# Patient Record
Sex: Female | Born: 1990 | Hispanic: No | Marital: Single | Smoking: Current every day smoker
Health system: Southern US, Community
[De-identification: ages and names within clinical notes are randomized; demographics above are authoritative.]

## PROBLEM LIST (undated history)

## (undated) HISTORY — PX: ANTERIOR CRUCIATE LIGAMENT REPAIR: SHX115

---

## 2017-01-14 ENCOUNTER — Ambulatory Visit (HOSPITAL_COMMUNITY)
Admission: RE | Admit: 2017-01-14 | Discharge: 2017-01-14 | Disposition: A | Discharge status: Home / Self Care | Payer: Self-pay | Attending: Psychiatry | Admitting: Psychiatry

## 2017-01-14 DIAGNOSIS — F331 Major depressive disorder, recurrent, moderate: Secondary | ICD-10-CM | POA: Insufficient documentation
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
## 2017-01-14 NOTE — H&P (Signed)
Behavioral Health Medical Screening Exam  Shelly Nunez is an 26 y.o. female.  Total Time spent with patient: 20 minutes  Psychiatric Specialty Exam: Physical Exam  Constitutional: She is oriented to person, place, and time. She appears well-developed and well-nourished.  HENT:  Head: Normocephalic and atraumatic.  Right Ear: External ear normal.  Left Ear: External ear normal.  Neck: Normal range of motion.  Cardiovascular: Normal rate, regular rhythm, normal heart sounds and intact distal pulses.   Respiratory: Effort normal and breath sounds normal.  Musculoskeletal: Normal range of motion.  Neurological: She is alert and oriented to person, place, and time.  Skin: Skin is warm and dry.    Review of Systems  Psychiatric/Behavioral: Positive for depression. Negative for hallucinations, memory loss, substance abuse and suicidal ideas. The patient is not nervous/anxious and does not have insomnia.   All other systems reviewed and are negative.   Blood pressure 127/76, pulse (!) 58, temperature 98.9 F (37.2 C), temperature source Oral, resp. rate 18, SpO2 100 %.There is no height or weight on file to calculate BMI.  General Appearance: Casual and Fairly Groomed  Eye Contact:  Good  Speech:  Clear and Coherent and Normal Rate  Volume:  Normal  Mood:  Depressed  Affect:  Congruent and Depressed  Thought Process:  Coherent, Goal Directed and Linear  Orientation:  Full (Time, Place, and Person)  Thought Content:  Logical  Suicidal Thoughts:  No  Homicidal Thoughts:  No  Memory:  Immediate;   Good Recent;   Good Remote;   Fair  Judgement:  Good  Insight:  Present  Psychomotor Activity:  Normal  Concentration: Concentration: Good and Attention Span: Good  Recall:  Good  Fund of Knowledge:Good  Language: Good  Akathisia:  No  Handed:  Right  AIMS (if indicated):     Assets:  Communication Skills Desire for Improvement Financial Resources/Insurance Housing Physical  Health Resilience Social Support Transportation Vocational/Educational  Sleep:       Musculoskeletal: Strength & Muscle Tone: within normal limits Gait & Station: normal Patient leans: N/A  Blood pressure 127/76, pulse (!) 58, temperature 98.9 F (37.2 C), temperature source Oral, resp. rate 18, SpO2 100 %.  Recommendations:  Based on my evaluation the patient does not appear to have an emergency medical condition.  Laveda Abbe, NP 01/14/2017, 4:20 PM

## 2018-01-25 ENCOUNTER — Emergency Department (HOSPITAL_COMMUNITY): Payer: Self-pay

## 2018-01-25 ENCOUNTER — Emergency Department (HOSPITAL_COMMUNITY)
Admission: EM | Admit: 2018-01-25 | Discharge: 2018-01-25 | Disposition: A | Discharge status: Home / Self Care | Payer: Self-pay | Attending: Emergency Medicine | Admitting: Emergency Medicine

## 2018-01-25 ENCOUNTER — Other Ambulatory Visit: Payer: Self-pay

## 2018-01-25 ENCOUNTER — Encounter (HOSPITAL_COMMUNITY): Payer: Self-pay

## 2018-01-25 ENCOUNTER — Encounter (HOSPITAL_COMMUNITY): Payer: Self-pay | Admitting: Emergency Medicine

## 2018-01-25 DIAGNOSIS — R2243 Localized swelling, mass and lump, lower limb, bilateral: Secondary | ICD-10-CM | POA: Insufficient documentation

## 2018-01-25 DIAGNOSIS — R600 Localized edema: Secondary | ICD-10-CM

## 2018-01-25 DIAGNOSIS — R609 Edema, unspecified: Secondary | ICD-10-CM

## 2018-01-25 DIAGNOSIS — D72819 Decreased white blood cell count, unspecified: Secondary | ICD-10-CM | POA: Insufficient documentation

## 2018-01-25 DIAGNOSIS — F17210 Nicotine dependence, cigarettes, uncomplicated: Secondary | ICD-10-CM | POA: Insufficient documentation

## 2018-01-25 DIAGNOSIS — J8410 Pulmonary fibrosis, unspecified: Secondary | ICD-10-CM | POA: Insufficient documentation

## 2018-01-25 DIAGNOSIS — D649 Anemia, unspecified: Secondary | ICD-10-CM | POA: Insufficient documentation

## 2018-01-25 DIAGNOSIS — E041 Nontoxic single thyroid nodule: Secondary | ICD-10-CM | POA: Insufficient documentation

## 2018-01-25 LAB — URINALYSIS, ROUTINE W REFLEX MICROSCOPIC
BILIRUBIN URINE: NEGATIVE
Glucose, UA: NEGATIVE mg/dL
KETONES UR: NEGATIVE mg/dL
Nitrite: NEGATIVE
PROTEIN: NEGATIVE mg/dL
Specific Gravity, Urine: 1.01 (ref 1.005–1.030)
pH: 6 (ref 5.0–8.0)

## 2018-01-25 LAB — COMPREHENSIVE METABOLIC PANEL
ALK PHOS: 39 U/L (ref 38–126)
ALT: 13 U/L — ABNORMAL LOW (ref 14–54)
ANION GAP: 11 (ref 5–15)
AST: 17 U/L (ref 15–41)
Albumin: 3.3 g/dL — ABNORMAL LOW (ref 3.5–5.0)
BILIRUBIN TOTAL: 0.2 mg/dL — AB (ref 0.3–1.2)
BUN: 13 mg/dL (ref 6–20)
CALCIUM: 9.1 mg/dL (ref 8.9–10.3)
CO2: 27 mmol/L (ref 22–32)
Chloride: 104 mmol/L (ref 101–111)
Creatinine, Ser: 0.71 mg/dL (ref 0.44–1.00)
GLUCOSE: 83 mg/dL (ref 65–99)
POTASSIUM: 3.6 mmol/L (ref 3.5–5.1)
Sodium: 142 mmol/L (ref 135–145)
TOTAL PROTEIN: 5.6 g/dL — AB (ref 6.5–8.1)

## 2018-01-25 LAB — CBC WITH DIFFERENTIAL/PLATELET
BASOS ABS: 0 10*3/uL (ref 0.0–0.1)
BASOS PCT: 0 %
Eosinophils Absolute: 0.1 10*3/uL (ref 0.0–0.7)
Eosinophils Relative: 4 %
HEMATOCRIT: 33.7 % — AB (ref 36.0–46.0)
Hemoglobin: 11 g/dL — ABNORMAL LOW (ref 12.0–15.0)
LYMPHS PCT: 50 %
Lymphs Abs: 1.9 10*3/uL (ref 0.7–4.0)
MCH: 29.6 pg (ref 26.0–34.0)
MCHC: 32.6 g/dL (ref 30.0–36.0)
MCV: 90.6 fL (ref 78.0–100.0)
MONO ABS: 0.3 10*3/uL (ref 0.1–1.0)
Monocytes Relative: 8 %
Neutro Abs: 1.5 10*3/uL — ABNORMAL LOW (ref 1.7–7.7)
Neutrophils Relative %: 38 %
Platelets: 171 10*3/uL (ref 150–400)
RBC: 3.72 MIL/uL — AB (ref 3.87–5.11)
RDW: 13.7 % (ref 11.5–15.5)
WBC: 3.8 10*3/uL — AB (ref 4.0–10.5)

## 2018-01-25 LAB — POC URINE PREG, ED: Preg Test, Ur: NEGATIVE

## 2018-01-25 MED ORDER — IOPAMIDOL (ISOVUE-370) INJECTION 76%
100.0000 mL | Freq: Once | INTRAVENOUS | Status: AC | PRN
  Administered 2018-01-25: 100 mL via INTRAVENOUS

## 2018-01-25 MED ORDER — IOPAMIDOL (ISOVUE-300) INJECTION 61%: 100.0000 mL | Freq: Once | INTRAVENOUS | Status: DC | PRN

## 2018-01-25 MED ORDER — IOPAMIDOL (ISOVUE-370) INJECTION 76%
INTRAVENOUS | Status: AC
  Filled 2018-01-25: qty 100

## 2018-01-25 NOTE — ED Triage Notes (Signed)
Pt reports having tick bite on left leg below knee several days ago and then today pt noticed swelling to bilateral ankles and numbness extending towards knees.

## 2018-01-25 NOTE — Care Management Note (Signed)
Case Management Note  CM consulted for no pcp and no ins.  CM placed [facility] for [facility] and the [facility] along with pharmacy and financial counseling on pt's AVS.  CM spoke with pt at bedside to discuss clinics, financial counseling, and pharmacy needs.  Elnoria Howard, PA.  No further CM needs noted at this time.

## 2018-01-25 NOTE — ED Notes (Signed)
Asked for urine  

## 2018-01-25 NOTE — Discharge Instructions (Signed)
Please see the information and instructions below regarding your visit.  Your diagnoses today include:  1. Bilateral lower extremity edema   2. Swelling   3. Thyroid nodule   4. Calcified granuloma of lung (HCC)    Your work-up is very reassuring today, but there may still be more testing to do such as testing for inflammatory or autoimmune diseases.  These test can be performed outpatient.  We have evaluated you for the emergent causes of leg swelling.  Tests performed today include: See side panel of your discharge paperwork for testing performed today. Vital signs are listed at the bottom of these instructions.   Your CT scan was very reassuring today.  It appears that the nodules in your lung are calcifications or granulomas, which can be areas where the body has walled off previous infections.    Your CT scan also showed that she may have a thyroid nodule of your left thyroid.  This will need to be followed with a nonemergent ultrasound of the thyroid when she obtain a primary care provider.  No evidence of masses in the belly concerning for cancer that are compressing on your vessels causing the swelling.  Medications prescribed:    Take any prescribed medications only as prescribed, and any over the counter medications only as directed on the packaging.  Home care instructions:  Please follow any educational materials contained in this packet.   Please try to reduce your alcohol drinking.  The CDC recommendation for someone your ages less than 5 drinks a week, and anything more than 4-5 drinks in one sitting is binge drinking.  This can lead to nutritional deficiencies that can worsen lower extremity swelling.  Please increase the amount of protein in your diet, and encourage protein snacks such as nuts, cheeses, yogurt.  I recommend support hose for your lower cavity swelling.  A brand commonly used is Scientist, water quality.  Follow-up instructions: Please follow-up with your primary  care provider as soon as possible for further evaluation of your symptoms if they are not completely improved.   You likely will eventually need follow-up with an ear nose and throat doctor for following thyroid nodules.  Return instructions:  Please return to the Emergency Department if you experience worsening symptoms.  Please return to the emergency department if you develop any shortness of breath, chest pain, worsening leg swelling, redness in the legs, or new or worsening symptoms. Please return if you have any other emergent concerns.  Additional Information:   Your vital signs today were: BP 132/86    Pulse (!) 103    Temp 98.2 F (36.8 C) (Oral)    Resp 14    Ht  (1.905 m)    Wt 65.3 kg (144 lb)    LMP 01/16/2018    SpO2 97%    BMI 18.00 kg/m  If your blood pressure (BP) was elevated on multiple readings during this visit above 130 for the top number or above 80 for the bottom number, please have this repeated by your primary care provider within one month. --------------  Thank you for allowing [place] to participate in your care today.

## 2018-01-25 NOTE — ED Provider Notes (Signed)
[facility]-EMERGENCY DEPT Provider Note   CSN: [id number] Arrival date & time: 01/25/18  0035     History   Chief Complaint Chief Complaint  Patient presents with  . Insect Bite  . Leg Swelling    HPI Shelly Nunez is a 27 y.o. female.  HPI  Patient is a 27 year old female with a history of anxiety and depression presenting for bilateral lower extremity edema and tick bite.  Patient reports that she was bitten by what she suspects was a tick that she brushed off [place] a shopping [place].  Patient reports she has a residual punctate erythematous lesion, but no spreading erythema from the site.  Patient noticed that 2 days ago, she began having bilateral lower extremity edema worse around her lower ankles.  Patient denies any erythema of the lower extremity's.  Patient reports that while she was shaving her left leg, she had some decreased sensation over the shin, however this is resolved.  Patient denies any history of lower extremity swelling.  Patient denies any recent immobilization, hospitalization, estrogen use, history DVT/PE, recent surgeries, history of cancer.  Patient denies any abdominal pain, flank pain, back pain, pelvic pain, dysuria, urgency, frequency, vaginal discharge, or vaginal bleeding.  Patient denies any chest pain, shortness of breath.  Patient denies any recent long periods of standing, dependent leg edema, or strenuous activity of the lower extremities.  History reviewed. No pertinent past medical history.  There are no active problems to display for this patient.   Past Surgical History:  Procedure Laterality Date  . ANTERIOR CRUCIATE LIGAMENT REPAIR       OB History   None      Home Medications    Prior to Admission medications   Not on File    Family History History reviewed. No pertinent family history.  Social History Social History   Tobacco Use  . Smoking status: Current Every Day Smoker    Packs/day:  0.50    Types: Cigarettes  . Smokeless tobacco: Never Used  Substance Use Topics  . Alcohol use: Yes    Frequency: Never  . Drug use: Yes    Types: Cocaine    Comment: heroine     Allergies   Nickel and Penicillins   Review of Systems Review of Systems  Constitutional: Negative for chills and fever.  Respiratory: Negative for shortness of breath.   Cardiovascular: Positive for leg swelling. Negative for chest pain and palpitations.  Gastrointestinal: Negative for abdominal pain, nausea and vomiting.  Genitourinary: Negative for dysuria, flank pain, frequency and urgency.  Musculoskeletal: Negative for back pain, gait problem and joint swelling.  Skin: Negative for color change, rash and wound.  Neurological: Negative for weakness and numbness.  All other systems reviewed and are negative.    Physical Exam Updated Vital Signs BP (!) 135/94 (BP Location: Left Arm)   Pulse 76   Temp 98.2 F (36.8 C) (Oral)   Resp 13   Ht  (1.905 m)   Wt 65.3 kg (144 lb)   LMP 01/16/2018   SpO2 100%   BMI 18.00 kg/m   Physical Exam  Constitutional: She appears well-developed and well-nourished. No distress.  HENT:  Head: Normocephalic and atraumatic.  Mouth/Throat: Oropharynx is clear and moist.  Eyes: Pupils are equal, round, and reactive to light. Conjunctivae and EOM are normal.  Neck: Normal range of motion. Neck supple.  Cardiovascular: Normal rate, regular rhythm, S1 normal and S2 normal.  No  murmur heard. Pulmonary/Chest: Effort normal and breath sounds normal. She has no wheezes. She has no rales.  Abdominal: Soft. She exhibits no distension. There is no tenderness. There is no guarding.  Musculoskeletal: Normal range of motion. She exhibits edema. She exhibits no deformity.  Patient exhibits approximately 2+ lower extremity pitting edema of the left ankle, extending approximately one third up the shin.  Right ankle exhibits approximately 1-2+ pitting edema to  approximately one third up the shin.  Compartments are soft.  2+ DP pulses bilaterally.  Lymphadenopathy:    She has no cervical adenopathy.  Neurological: She is alert.  Cranial nerves grossly intact. Patient moves extremities symmetrically and with good coordination.  Skin: Skin is warm and dry. No rash noted. No erythema.  Psychiatric: She has a normal mood and affect. Her behavior is normal. Judgment and thought content normal.  Nursing note and vitals reviewed.    ED Treatments / Results  Labs (all labs ordered are listed, but only abnormal results are displayed) Labs Reviewed  COMPREHENSIVE METABOLIC PANEL  CBC WITH DIFFERENTIAL/PLATELET  URINALYSIS, ROUTINE W REFLEX MICROSCOPIC  POC URINE PREG, ED    EKG None  Radiology No results found.  Procedures Procedures (including critical care time)  Medications Ordered in ED Medications - No data to display   Initial Impression / Assessment and Plan / ED Course  I have reviewed the triage vital signs and the nursing notes.  Pertinent labs & imaging results that were available during my care of the patient were reviewed by me and considered in my medical decision making (see chart for details).  Clinical Course as of Jan 25 1117  Mon Jan 25, 2018  1610 Reassessed patient.  Discussed patient her lab work and chest x-ray results.  Given that chest x-ray has a possible nodule versus infiltrate versus mass, and patient's symptoms are concerning for some kind of vascular anomaly and patient is [age] without family history of such height, differential diagnosis now including abdominal mass compressing external iliacs versus dissection.  Patient is not having any chest or abdominal symptoms, but feel that work-up is warranted for that this time.  Patient and apparently evaluated by Dr. Gwyneth Sprout. Patient periodically falling asleep while talking, but otherwise neuro intact with normal speech.   [AM]    Clinical Course User  Index [AM] Elisha Ponder, PA-C    Patient is nontoxic-appearing and in no acute distress.  Patient has a punctate lesion of the left shin consistent with insect bite.  Given the length of time on the patient, have lower suspicion for tick bite.  Additionally, there is no spreading erythema to suggest erythema migrans.  No joint pain.  No other neurologic symptoms.  Lower extremity edema not associated with erythema, therefore less likely to be infectious.  Differential diagnosis for lower extremity edema is vascular outflow obstruction, possibly higher up in the extremities, liver disease, kidney disease, nutritional deficiency, heart failure.  Will obtain CBC, CMP, chest x-ray, urinalysis.  Lab work significant for normocytic anemia in the setting of moderately heavy menses.  Very slight leukopenia at 3.8, with slightly low neutrophil count at 1.5.  Do not find this particular concerning.  Patient does not have baseline lab work to compare.  CMP significant for slightly low total protein at 5.6, slight hypoalbuminemia at 3.3.  Could be nutritional, but remainder of lab work not concerning for primary hepatobiliary disease.  Examination not consistent with ascites.  Patient is not pregnant, and urinalysis  not concerning for infection.  CT angios chest abdomen and pelvis looking for possible dissection given patient's tall and lanky body habitus demonstrates no vascular abnormalities.  Patient has multiple calcified granuloma type lesions in the lungs, but per radiology not concerning for masses.  Patient also exhibits a left thyroid nodule, and was instructed to follow-up.  No evidence of masses compressing external iliac vasculature and causing lower extremity edema.  Case management consulted and visited with patient regarding finding primary care provider while in an interim insurance situation.  Patient instructed to follow-up on above findings.  Patient is in understanding and agrees with the plan  of care.  Patient given instructions on maximum alcohol use, improving nutrition, and using support hose.  Also discussed with patient that autoimmune and inflammatory disease not ruled out at this time, but possible.  Will need follow-up.  This is a shared visit with Dr. Gwyneth Sprout. Patient was independently evaluated by this attending physician. Attending physician consulted in evaluation and discharge management.  Final Clinical Impressions(s) / ED Diagnoses   Final diagnoses:  Bilateral lower extremity edema  Thyroid nodule  Calcified granuloma of lung (HCC)  Normocytic anemia  Leukopenia, unspecified type    ED Discharge Orders    None       Delia Chimes 01/25/18 1123    Gwyneth Sprout, MD 01/26/18 2214

## 2018-02-19 NOTE — Congregational Nurse Program (Signed)
Congregational Nurse Program Note  Date of Encounter: 02/17/2018  Past Medical History: No past medical history on file.  Encounter Details: CNP Questionnaire - 02/16/18 1254      Questionnaire   Patient Status  Not Applicable    Race  White or Caucasian    [facility]    Insurance  Not Applicable    Uninsured  Uninsured (NEW 1x/quarter)    Food  Yes, have food insecurities;Within past 12 months, worried food would run out with no money to buy more    Housing/Utilities  Yes, have permanent housing;Worried about losing housing    Transportation  No transportation needs    Economistnterpersonal Safety  Yes, feel physically and emotionally safe where you currently live    Medication  Yes, have medication insecurities    Medical Provider  No    Referrals  Area Agency    ED Visit Averted  Not Applicable    Life-Saving Intervention Made  Not Applicable      Requesting assistance with going back on her behavioral health meds.  Client will need a provider.  [facility]

## 2018-02-25 NOTE — Congregational Nurse Program (Signed)
Congregational Nurse Program Note  Date of Encounter: 02/24/2018  Past Medical History: No past medical history on file.  Encounter Details: CNP Questionnaire - 02/24/18 1312      Questionnaire   Patient Status  Not Applicable    Race  White or Caucasian    Location Patient Served At  [facility]    Insurance  Not Applicable    Uninsured  Uninsured (Subsequent visits/quarter)    Food  Yes, have food insecurities;Within past 12 months, worried food would run out with no money to buy more    Housing/Utilities  Yes, have permanent housing;Worried about losing housing    Transportation  No transportation needs    Economistnterpersonal Safety  Yes, feel physically and emotionally safe where you currently live    Medication  Yes, have medication insecurities    Medical Provider  No    Referrals  Area Agency    ED Visit Averted  Not Applicable    Life-Saving Intervention Made  Not Applicable      Client reports is feeling more depressed.  Denies SI/HI.  Encouraged her to follow through with mental health provider

## 2018-03-14 NOTE — Congregational Nurse Program (Signed)
Congregational Nurse Program Note  Date of Encounter: 03/10/2018  Past Medical History: No past medical history on file.  Encounter Details: CNP Questionnaire - 03/10/18 1854      Questionnaire   Patient Status  Not Applicable    Race  White or Caucasian    Location Patient Served At  [facility]    Insurance  Not Applicable    Uninsured  Uninsured (Subsequent visits/quarter)    Food  Yes, have food insecurities;Within past 12 months, worried food would run out with no money to buy more    Housing/Utilities  Yes, have permanent housing;Worried about losing housing    Transportation  No transportation needs    Economistnterpersonal Safety  Yes, feel physically and emotionally safe where you currently live    Medication  Yes, have medication insecurities    Medical Provider  No    Referrals  Not Applicable    ED Visit Averted  Not Applicable    Life-Saving Intervention Made  Not Applicable       Client states she believes her depression has improved.  Affect bright.  Mood and affect congruent

## 2018-04-16 NOTE — Congregational Nurse Program (Signed)
Congregational Nurse Program Note  Date of Encounter: 03/25/2018  Past Medical History: No past medical history on file.  Encounter Details: CNP Questionnaire - 03/25/18 1033      Questionnaire   Patient Status  Not Applicable    Race  White or Caucasian    Location Patient Served At  [facility]    Insurance  Not Applicable    Uninsured  Uninsured (NEW 1x/quarter)    Food  Yes, have food insecurities;Within past 12 months, worried food would run out with no money to buy more    Housing/Utilities  Yes, have permanent housing;Worried about losing housing    Transportation  No transportation needs    Economistnterpersonal Safety  Yes, feel physically and emotionally safe where you currently live    Medication  Yes, have medication insecurities    Medical Provider  No    Referrals  Behavioral/Mental Health Provider    ED Visit Averted  Not Applicable    Life-Saving Intervention Made  Not Applicable      Client became tearful, stating, "I do not want to live this way anymore".  Client requesting treatment.  Referred client to [facility] [place].

## 2018-04-16 NOTE — Congregational Nurse Program (Signed)
Congregational Nurse Program Note  Date of Encounter: 04/08/2018  Past Medical History: No past medical history on file.  Encounter Details: CNP Questionnaire - 04/08/18 1039      Questionnaire   Patient Status  Not Applicable    Race  White or Caucasian    [facility]  [facility]    Insurance  Not Applicable    Uninsured  Uninsured (NEW 1x/quarter)    Food  Yes, have food insecurities;Within past 12 months, worried food would run out with no money to buy more    Housing/Utilities  Yes, have permanent housing;Worried about losing housing    Transportation  No transportation needs    Economistnterpersonal Safety  Yes, feel physically and emotionally safe where you currently live    Medication  Yes, have medication insecurities    Medical Provider  No    Referrals  Behavioral/Mental Health Provider    ED Visit Averted  Not Applicable    Life-Saving Intervention Made  Not Applicable      Client seen in triage at [facility].  Is agreeable to begin outpatient treatment.  Client supported in her decision to continue treatment

## 2018-04-16 NOTE — Congregational Nurse Program (Signed)
Congregational Nurse Program Note  Date of Encounter: 04/15/2018  Past Medical History: No past medical history on file.  Encounter Details: CNP Questionnaire - 04/15/18 1042      Questionnaire   Patient Status  Not Applicable    Race  White or Caucasian    [facility] Patient [facility]    Insurance  Not Applicable    Uninsured  Uninsured (NEW 1x/quarter)    Food  Yes, have food insecurities;Within past 12 months, worried food would run out with no money to buy more    Housing/Utilities  Yes, have permanent housing;Worried about losing housing    Transportation  No transportation needs    Economistnterpersonal Safety  Yes, feel physically and emotionally safe where you currently live    Medication  Yes, have medication insecurities    Medical Provider  No    Referrals  Behavioral/Mental Health Provider    ED Visit Averted  Not Applicable    Life-Saving Intervention Made  Not Applicable      Client came to clinic to "check in".  Reports feeling very stressed and depressed.  She will loose her apartment on August 10 and has no place to go.  Processed with her treatment options as well as transitional living [facility]  Client resistant due to having a dog that she does not want to part with.  Problem solving around the care of her dog and her need to continue on her path of recovery.  Client has not been to [facility] to begin treatment.  States she does plan on doing so.  Client is also considering [facility]

## 2018-04-16 NOTE — Congregational Nurse Program (Signed)
Congregational Nurse Program Note  Date of Encounter: 04/01/2018  Past Medical History: No past medical history on file.  Encounter Details: CNP Questionnaire - 04/01/18 1035      Questionnaire   Patient Status  Not Applicable    Race  White or Caucasian    [facility]    Insurance  Not Applicable    Uninsured  Uninsured (NEW 1x/quarter)    Food  Yes, have food insecurities;Within past 12 months, worried food would run out with no money to buy more    Housing/Utilities  Yes, have permanent housing;Worried about losing housing    Transportation  No transportation needs    Economistnterpersonal Safety  Yes, feel physically and emotionally safe where you currently live    Medication  Yes, have medication insecurities    Medical Provider  No    Referrals  Behavioral/Mental Health Provider    ED Visit Averted  Not Applicable    Life-Saving Intervention Made  Not Applicable       Client, sobbing - reporting she had to leave the treatment center.  Client reportedly left the center because she "could not fight the need to use".  Believes that was not the best place for her as her desire to use increased.  Discussed with client other options.  Client agreed to go to [facility] for assessment

## 2018-05-10 NOTE — Congregational Nurse Program (Signed)
Came to clinic for supplies.  Client reports she not "doing well".  Very tearful.  Feels hopeless. Discussed with her barriers to keep her from sobriety.  Tearfully states, she does not know.  States maybe it is because "I do not want to stop".  She is aware of her ambivalence.  Encouraged client to RTC next week to begin work on plan for treatment.  Client agreed

## 2018-05-22 NOTE — Congregational Nurse Program (Signed)
Counseling visit.  Client presents with improved mood and thoughts more clear and congruent to context.  Encouraged client to explore barriers to her sobriety.  Client identifies "loosing control", however she is also aware that when she is using she has no control.  Client looking at outpatient treatment options.

## 2018-05-28 NOTE — Congregational Nurse Program (Signed)
Although affect is brighter, client reports feeling very depressed.  States depression is affecting her ability to seek treatment.  Client has agreed to complete one thing towards recovery this week and will report back to me on Monday the 16th.  Client will contact [facility] to schedule an appointment

## 2018-06-17 NOTE — Congregational Nurse Program (Signed)
Client came to the [facility] as suggested.  Connected with Lavinia Sharps NP for medication management and social work intern for resources

## 2018-06-17 NOTE — Congregational Nurse Program (Signed)
Affect is bright.  States is wanting to get off the drugs, but is fearful.  She has been going to [facility].  Will begin counseling with social work Tax inspector, continue groups and go to the [facility] for services.  Client has been on anti-depressants in the past and is aware that she needs to be back on them.  Referred to Lavinia Sharps NP at the [facility]

## 2018-06-28 ENCOUNTER — Other Ambulatory Visit: Payer: Self-pay

## 2018-06-28 ENCOUNTER — Encounter: Payer: Self-pay | Admitting: Emergency Medicine

## 2018-06-28 ENCOUNTER — Emergency Department: Admission: EM | Admit: 2018-06-28 | Discharge: 2018-06-29 | Discharge status: Against Medical Advice | Payer: Self-pay

## 2018-06-28 NOTE — ED Notes (Signed)
Pt walked out of ED lobby prior to taking vs; not found in lobby, restroom or outside

## 2018-06-28 NOTE — ED Notes (Signed)
Pt not found in lobby, restroom, or outside 

## 2018-06-28 NOTE — ED Notes (Signed)
Pt not found in lobby, restroom, or outside

## 2018-06-28 NOTE — ED Triage Notes (Addendum)
Patient ambulatory to triage with steady gait, without difficulty or distress noted; pt reports that she was sent over by the police to have herself checked for possible fentanyl that may have been in heroin that she used tonight at 8pm; pt is also inquiring over detox

## 2018-07-07 NOTE — Congregational Nurse Program (Signed)
Assisted with getting her medications from the health department medication assistance program.

## 2018-07-07 NOTE — Congregational Nurse Program (Signed)
Counseling appointment with [employer] intern.

## 2018-07-07 NOTE — Congregational Nurse Program (Signed)
Counseling session with SW intern.  Requesting information about Suboxone treatment.  Gave information about [facility]

## 2018-07-07 NOTE — Congregational Nurse Program (Signed)
Client came late to her scheduled counseling appointment with social work Tax inspector.  When client arrived, tearful, expressing ambivalence about treatment.  Support given.  Client did remain for session with intern

## 2018-07-19 NOTE — Congregational Nurse Program (Signed)
Client engaged in counseling session with SW intern.  Client currently in shelter at [facility] with her mother

## 2018-07-19 NOTE — Congregational Nurse Program (Signed)
Client engaged in counseling session with SW intern

## 2018-08-05 NOTE — Congregational Nurse Program (Signed)
Met with social work intern to explore treatment options 

## 2018-08-19 NOTE — Congregational Nurse Program (Signed)
Met with client and her mom and a support person.  Discussed ways in which her support persons could assist her recovery. 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  She states she wants recovery but is finding the process too difficult.  Continuing to work on goals and treatment options

## 2018-08-19 NOTE — Congregational Nurse Program (Signed)
Client was in detox [facility].  Client could not connect with inpatient treatment.  Has obtained housing in [place].  Counseling with Social Work Tax inspectorntern

## 2018-09-22 NOTE — Congregational Nurse Program (Signed)
Well check, Client voiced no concerns at today's visit.

## 2018-10-15 ENCOUNTER — Other Ambulatory Visit: Payer: Self-pay

## 2018-10-15 ENCOUNTER — Encounter: Payer: Self-pay | Admitting: Emergency Medicine

## 2018-10-15 ENCOUNTER — Emergency Department
Admission: EM | Admit: 2018-10-15 | Discharge: 2018-10-15 | Disposition: A | Discharge status: Home / Self Care | Payer: Self-pay | Attending: Student in an Organized Health Care Education/Training Program | Admitting: Student in an Organized Health Care Education/Training Program

## 2018-10-15 DIAGNOSIS — B379 Candidiasis, unspecified: Secondary | ICD-10-CM

## 2018-10-15 DIAGNOSIS — B9689 Other specified bacterial agents as the cause of diseases classified elsewhere: Secondary | ICD-10-CM | POA: Insufficient documentation

## 2018-10-15 DIAGNOSIS — J039 Acute tonsillitis, unspecified: Secondary | ICD-10-CM | POA: Insufficient documentation

## 2018-10-15 DIAGNOSIS — N760 Acute vaginitis: Secondary | ICD-10-CM | POA: Insufficient documentation

## 2018-10-15 DIAGNOSIS — F17210 Nicotine dependence, cigarettes, uncomplicated: Secondary | ICD-10-CM | POA: Insufficient documentation

## 2018-10-15 DIAGNOSIS — B373 Candidiasis of vulva and vagina: Secondary | ICD-10-CM | POA: Insufficient documentation

## 2018-10-15 LAB — WET PREP, GENITAL
Sperm: NONE SEEN
TRICH WET PREP: NONE SEEN

## 2018-10-15 LAB — CHLAMYDIA/NGC RT PCR (ARMC ONLY)
Chlamydia Tr: NOT DETECTED
N gonorrhoeae: NOT DETECTED

## 2018-10-15 LAB — GROUP A STREP BY PCR: GROUP A STREP BY PCR: NOT DETECTED

## 2018-10-15 MED ORDER — METRONIDAZOLE 500 MG PO TABS: 500.0000 mg | ORAL_TABLET | Freq: Two times a day (BID) | ORAL | 0 refills | Status: AC

## 2018-10-15 MED ORDER — LIDOCAINE VISCOUS HCL 2 % MT SOLN
15.0000 mL | Freq: Once | OROMUCOSAL | Status: AC
  Administered 2018-10-15: 15 mL via OROMUCOSAL
  Filled 2018-10-15: qty 15

## 2018-10-15 MED ORDER — AMOXICILLIN-POT CLAVULANATE 875-125 MG PO TABS: 1.0000 | ORAL_TABLET | Freq: Two times a day (BID) | ORAL | 0 refills | Status: AC

## 2018-10-15 MED ORDER — LIDOCAINE VISCOUS HCL 2 % MT SOLN: 10.0000 mL | OROMUCOSAL | 0 refills | Status: AC | PRN

## 2018-10-15 MED ORDER — FLUCONAZOLE 100 MG PO TABS: 150.0000 mg | ORAL_TABLET | Freq: Once | ORAL | 0 refills | Status: AC

## 2018-10-15 MED ORDER — AMOXICILLIN 250 MG/5ML PO SUSR
500.0000 mg | Freq: Once | ORAL | Status: AC
  Administered 2018-10-15: 500 mg via ORAL
  Filled 2018-10-15: qty 10

## 2018-10-15 MED ORDER — AMOXICILLIN 400 MG/5ML PO SUSR: 500.0000 mg | Freq: Three times a day (TID) | ORAL | 0 refills | Status: DC

## 2018-10-15 MED ORDER — LIDOCAINE VISCOUS HCL 2 % MT SOLN: 10.0000 mL | OROMUCOSAL | 0 refills | Status: DC | PRN

## 2018-10-15 NOTE — ED Notes (Signed)
Pt given Amoxicllin and Lidocaine, pt also stated that she thinks she has BV and has been having vaginal discharge with an odor.  Informed [facility].

## 2018-10-15 NOTE — ED Triage Notes (Signed)
Pt arrived via POV with reports of sxs x 1 week, reports scratchy throat initially, pt states over the past week the pain has worsened.  Pt states sxs are similar to previous episodes of strep.  Pt reports subjective fevers at home.  Denies any post nasal drainage. Pt c/o pain with swallowing.   Pt has taken excedrin migraine for sxs.

## 2018-10-15 NOTE — ED Provider Notes (Signed)
[facility] Emergency Department Provider Note  ____________________________________________  Time seen: Approximately 2:02 PM  I have reviewed the triage vital signs and the nursing notes.   HISTORY  Chief Complaint Sore Throat    HPI Shelly Nunez is a 28 y.o. female that presents emergency department for evaluation of sore throat for 1 week.  Patient states that she has a history of strep throat and this feels the same.  She does not believe she has had a fever.  Patient has in her medical history that she is allergic to penicillin but states that she has never had penicillin before but mother is allergic to penicillin so has been told not to take it.  She has taken amoxicillin as a child without any reaction.  No fatigue, body aches, nasal congestion, cough, shortness of breath.  History reviewed. No pertinent past medical history.  There are no active problems to display for this patient.   Past Surgical History:  Procedure Laterality Date  . ANTERIOR CRUCIATE LIGAMENT REPAIR      Prior to Admission medications   Medication Sig Start Date End Date Taking? Authorizing Provider  amoxicillin-clavulanate (AUGMENTIN) 875-125 MG tablet Take 1 tablet by mouth 2 (two) times daily for 10 days. 10/15/18 10/25/18  Enid Derry, PA-C  ARIPiprazole (ABILIFY) 2 MG tablet Take 2 mg by mouth daily.    [provider]  aspirin-acetaminophen-caffeine (EXCEDRIN MIGRAINE) [phone] MG tablet Take 2 tablets by mouth every 6 (six) hours as needed for headache.    [provider]  fluconazole (DIFLUCAN) 100 MG tablet Take 1.5 tablets (150 mg total) by mouth once for 1 dose. 10/15/18 10/15/18  Enid Derry, PA-C  lidocaine (XYLOCAINE) 2 % solution Use as directed 10 mLs in the mouth or throat as needed for mouth pain. 10/15/18   Enid Derry, PA-C  metroNIDAZOLE (FLAGYL) 500 MG tablet Take 1 tablet (500 mg total) by mouth 2 (two) times daily for 7 days.  10/15/18 10/22/18  Enid Derry, PA-C  sertraline (ZOLOFT) 100 MG tablet Take 200 mg by mouth as directed. Takes 2 tablets in the morning    [provider]    Allergies Nickel; Penicillin g; and Penicillins  History reviewed. No pertinent family history.  Social History Social History   Tobacco Use  . Smoking status: Current Every Day Smoker    Packs/day: 1.00    Types: Cigarettes  . Smokeless tobacco: Never Used  Substance Use Topics  . Alcohol use: Yes    Frequency: Never  . Drug use: Yes    Types: Cocaine, IV    Comment: heroine     Review of Systems  Constitutional: No fever/chills Eyes: No visual changes. No discharge. ENT: Negative for congestion and rhinorrhea. Cardiovascular: No chest pain. Respiratory: Negative for cough. No SOB. Gastrointestinal: No abdominal pain.  No nausea, no vomiting.  No diarrhea.  No constipation. Musculoskeletal: Negative for musculoskeletal pain. Skin: Negative for rash, abrasions, lacerations, ecchymosis. Neurological: Negative for headaches.   ____________________________________________   PHYSICAL EXAM:  VITAL SIGNS: ED Triage Vitals  Enc Vitals Group     BP 10/15/18 1339 117/75     Pulse Rate 10/15/18 1339 84     Resp --      Temp 10/15/18 1339 98.4 F (36.9 C)     Temp Source 10/15/18 1339 Oral     SpO2 10/15/18 1339 100 %     Weight 10/15/18 1342 135 lb (61.2 kg)     Height 10/15/18 1342  6\' 3"  (1.905 m)     Head Circumference --      Peak Flow --      Pain Score 10/15/18 1341 7     Pain Loc --      Pain Edu? --      Excl. in GC? --      Constitutional: Alert and oriented. Well appearing and in no acute distress. Eyes: Conjunctivae are normal. PERRL. EOMI. No discharge. Head: Atraumatic. ENT: No frontal and maxillary sinus tenderness.      Ears: Tympanic membranes pearly gray with good landmarks. No discharge.      Nose: Mild congestion/rhinnorhea.      Mouth/Throat: Mucous membranes are moist.  Oropharynx erythematous. Tonsils enlarged 1+ bilaterally with exudates. Uvula midline. Neck: No stridor.   Hematological/Lymphatic/Immunilogical: No cervical lymphadenopathy. Cardiovascular: Normal rate, regular rhythm.  Good peripheral circulation. Respiratory: Normal respiratory effort without tachypnea or retractions. Lungs CTAB. Good air entry to the bases with no decreased or absent breath sounds. Gastrointestinal: Bowel sounds 4 quadrants. Soft and nontender to palpation. No guarding or rigidity. No palpable masses. No distention. Genitourinary: No external rashes or lesions seen.  Blood in vaginal canal.  No cervical motion tenderness. Musculoskeletal: Full range of motion to all extremities. No gross deformities appreciated. Neurologic:  Normal speech and language. No gross focal neurologic deficits are appreciated.  Skin:  Skin is warm, dry and intact. No rash noted. Psychiatric: Mood and affect are normal. Speech and behavior are normal. Patient exhibits appropriate insight and judgement.   ____________________________________________   LABS (all labs ordered are listed, but only abnormal results are displayed)  Labs Reviewed  WET PREP, GENITAL - Abnormal; Notable for the following components:      Result Value   Yeast Wet Prep HPF POC PRESENT (*)    Clue Cells Wet Prep HPF POC PRESENT (*)    WBC, Wet Prep HPF POC FEW (*)    All other components within normal limits  GROUP A STREP BY PCR  CHLAMYDIA/NGC RT PCR ([facility] ONLY)   ____________________________________________  EKG   ____________________________________________  RADIOLOGY   No results found.  ____________________________________________    PROCEDURES  Procedure(s) performed:    Procedures    Medications  amoxicillin (AMOXIL) 250 MG/5ML suspension 500 mg (500 mg Oral Given 10/15/18 1412)  lidocaine (XYLOCAINE) 2 % viscous mouth solution 15 mL (15 mLs Mouth/Throat Given 10/15/18 1412)      ____________________________________________   INITIAL IMPRESSION / ASSESSMENT AND PLAN / ED COURSE  Pertinent labs & imaging results that were available during my care of the patient were reviewed by me and considered in my medical decision making (see chart for details).  Review of the [place] CSRS was performed in accordance of the [facility] prior to dispensing any controlled drugs.   Patient's diagnosis is consistent with tonsillitis. Vital signs and exam are reassuring.  Strep test is negative. However, exam is consistent with strep.  Patient had a dose of amoxicillin in the emergency department and did not have any reaction while in the emergency department for 1 hour after medication.  Just prior to discharge, patient request to be tested for bacterial vaginosis.  Patient states that she has noticed an vaginal odor with some yellow vaginal discharge recently.  No abdominal pain or pelvic pain.  She has a history of yeast infections and bacterial vaginosis.  Wet prep consistent with bacterial vaginosis and yeast.  Patient appears well and is staying well hydrated. Patient should alternate tylenol and  ibuprofen for fever. Patient feels comfortable going home. Patient will be discharged home with prescriptions for Augmentin, Flagyl, Diflucan. Patient is to follow up with primary care as needed or otherwise directed.  Primary care resources were given.  Patient is given ED precautions to return to the ED for any worsening or new symptoms.     ____________________________________________  FINAL CLINICAL IMPRESSION(S) / ED DIAGNOSES  Final diagnoses:  Tonsillitis  Bacterial vaginosis  Yeast infection      NEW MEDICATIONS STARTED DURING THIS VISIT:  ED Discharge Orders         Ordered    amoxicillin (AMOXIL) 400 MG/5ML suspension  3 times daily,   Status:  Discontinued     10/15/18 1409    lidocaine (XYLOCAINE) 2 % solution  As needed,   Status:  Discontinued     10/15/18 1409     metroNIDAZOLE (FLAGYL) 500 MG tablet  2 times daily     10/15/18 1521    fluconazole (DIFLUCAN) 100 MG tablet   Once     10/15/18 1521    lidocaine (XYLOCAINE) 2 % solution  As needed     10/15/18 1521    amoxicillin-clavulanate (AUGMENTIN) 875-125 MG tablet  2 times daily     10/15/18 1521              This chart was dictated using voice recognition software/Dragon. Despite best efforts to proofread, errors can occur which can change the meaning. Any change was purely unintentional.    Enid DerryWagner, Jaedyn Lard, PA-C 10/15/18 1752    Willy Eddyobinson, Patrick, MD 10/16/18 1135

## 2018-12-08 NOTE — Congregational Nurse Program (Signed)
Pt voiced no physical concerns at today's visit, no sign or symptoms of distress observed

## 2019-12-04 ENCOUNTER — Emergency Department
Admission: EM | Admit: 2019-12-04 | Discharge: 2019-12-04 | Discharge status: Against Medical Advice | Payer: Self-pay | Attending: Emergency Medicine | Admitting: Emergency Medicine

## 2019-12-04 ENCOUNTER — Other Ambulatory Visit: Payer: Self-pay

## 2019-12-04 DIAGNOSIS — F17210 Nicotine dependence, cigarettes, uncomplicated: Secondary | ICD-10-CM | POA: Insufficient documentation

## 2019-12-04 DIAGNOSIS — J029 Acute pharyngitis, unspecified: Secondary | ICD-10-CM | POA: Insufficient documentation

## 2019-12-04 DIAGNOSIS — F1110 Opioid abuse, uncomplicated: Secondary | ICD-10-CM | POA: Insufficient documentation

## 2019-12-04 DIAGNOSIS — L03115 Cellulitis of right lower limb: Secondary | ICD-10-CM | POA: Insufficient documentation

## 2019-12-04 LAB — BASIC METABOLIC PANEL
Anion gap: 10 (ref 5–15)
BUN: 12 mg/dL (ref 6–20)
CO2: 29 mmol/L (ref 22–32)
Calcium: 9.7 mg/dL (ref 8.9–10.3)
Chloride: 95 mmol/L — ABNORMAL LOW (ref 98–111)
Creatinine, Ser: 0.64 mg/dL (ref 0.44–1.00)
GFR calc Af Amer: >60 mL/min (ref >60)
GFR calc non Af Amer: >60 mL/min (ref >60)
Glucose, Bld: 96 mg/dL (ref 70–99)
Potassium: 4.4 mmol/L (ref 3.5–5.1)
Sodium: 134 mmol/L — ABNORMAL LOW (ref 135–145)

## 2019-12-04 LAB — CBC
HCT: 41.5 % (ref 36.0–46.0)
Hemoglobin: 13.3 g/dL (ref 12.0–15.0)
MCH: 28.2 pg (ref 26.0–34.0)
MCHC: 32 g/dL (ref 30.0–36.0)
MCV: 87.9 fL (ref 80.0–100.0)
Platelets: 237 10*3/uL (ref 150–400)
RBC: 4.72 MIL/uL (ref 3.87–5.11)
RDW: 12.4 % (ref 11.5–15.5)
WBC: 11 10*3/uL — ABNORMAL HIGH (ref 4.0–10.5)
nRBC: 0 % (ref 0.0–0.2)

## 2019-12-04 LAB — GROUP A STREP BY PCR: Group A Strep by PCR: DETECTED — AB

## 2019-12-04 MED ORDER — CLINDAMYCIN HCL 300 MG PO CAPS: 300.0000 mg | ORAL_CAPSULE | Freq: Four times a day (QID) | ORAL | 0 refills | Status: AC

## 2019-12-04 NOTE — Discharge Instructions (Signed)
It is not advised that you leave before treatment today.  Your foot will likely get worse and may cause infection that can lead to serious illness or death.

## 2019-12-04 NOTE — ED Notes (Addendum)
Pt refused wheel chair, ambulating to lobby. Pt refuses all other care at this time.

## 2019-12-04 NOTE — ED Notes (Signed)
Unsuccessful attempt x 2 to draw blood. Will ask another triage staff member.

## 2019-12-04 NOTE — ED Notes (Signed)
Pt alert and oriented x4, pt able to state answers to all questions asked and do serial additions. Pt states understanding of refusal of care.

## 2019-12-04 NOTE — ED Provider Notes (Signed)
[facility] Emergency Department Provider Note ____________________________________________   None    (approximate)  I have reviewed the triage vital signs and the nursing notes.   HISTORY  Chief Complaint Cellulitis and Sore Throat  HPI Shelly Nunez is a 29 y.o. female who presents to the emergency department for treatment and evaluation of sore throat.  She also has right foot pain and swelling, but is unable to advise regarding injury.  She states that it may be from striking her foot on her metal bed frame.  She does not know how long her foot has been red and swollen.  Her main concern is her sore throat.  She states that she feels like she has strep throat and she would like some antibiotics and to then be discharged.      History reviewed. No pertinent past medical history.  There are no problems to display for this patient.   Past Surgical History:  Procedure Laterality Date  . ANTERIOR CRUCIATE LIGAMENT REPAIR      Prior to Admission medications   Medication Sig Start Date End Date Taking? Authorizing Provider  ARIPiprazole (ABILIFY) 2 MG tablet Take 2 mg by mouth daily.    [provider]  aspirin-acetaminophen-caffeine (EXCEDRIN MIGRAINE) [phone] MG tablet Take 2 tablets by mouth every 6 (six) hours as needed for headache.    [provider]  clindamycin (CLEOCIN) 300 MG capsule Take 1 capsule (300 mg total) by mouth in the morning, at noon, in the evening, and at bedtime for 10 days. 12/04/19 12/14/19  Kinda Pottle, Johnette Abraham B, FNP  lidocaine (XYLOCAINE) 2 % solution Use as directed 10 mLs in the mouth or throat as needed for mouth pain. 10/15/18   Laban Emperor, PA-C  sertraline (ZOLOFT) 100 MG tablet Take 200 mg by mouth as directed. Takes 2 tablets in the morning    [provider]    Allergies Nickel, Penicillin g, and Penicillins  History reviewed. No pertinent family history.  Social History Social History    Tobacco Use  . Smoking status: Current Every Day Smoker    Packs/day: 1.00    Types: Cigarettes  . Smokeless tobacco: Never Used  Substance Use Topics  . Alcohol use: Yes  . Drug use: Yes    Types: Cocaine, IV    Comment: heroine    Review of Systems  Constitutional: No fever/chills Eyes: No visual changes. ENT: Positive for sore throat. Cardiovascular: Denies chest pain. Respiratory: Denies shortness of breath. Gastrointestinal: No abdominal pain.  No nausea, no vomiting.  No diarrhea.  No constipation. Genitourinary: Negative for dysuria. Musculoskeletal: Negative for back pain. Skin: Positive for right foot erythema and swelling. Neurological: Negative for headaches, focal weakness or numbness. ____________________________________________   PHYSICAL EXAM:  VITAL SIGNS: ED Triage Vitals  Enc Vitals Group     BP 12/04/19 1533 (!) 134/95     Pulse Rate 12/04/19 1533 98     Resp 12/04/19 1533 18     Temp 12/04/19 1533 98 F (36.7 C)     Temp Source 12/04/19 1533 Oral     SpO2 12/04/19 1533 97 %     Weight 12/04/19 1534 120 lb (54.4 kg)     Height 12/04/19 1534 6\' 3"  (1.905 m)     Head Circumference --      Peak Flow --      Pain Score 12/04/19 1533 7     Pain Loc --      Pain Edu? --  Excl. in GC? --     Constitutional: Alert and oriented. Appears to be under the influence of substance.  Eyes: Conjunctivae are normal. PERRL. EOMI. Head: Atraumatic. Nose: No congestion/rhinnorhea. Mouth/Throat: Mucous membranes are moist. Oropharynx erythematous with tonsillar exudate. Neck: No stridor.   Hematological/Lymphatic/Immunilogical: No cervical lymphadenopathy. Cardiovascular: Normal rate, regular rhythm. Grossly normal heart sounds.  Good peripheral circulation. Respiratory: Normal respiratory effort.  No retractions. Lungs CTAB. Gastrointestinal: Soft and nontender. No distention. No abdominal bruits. No CVA tenderness. Genitourinary:  Musculoskeletal:  Full ROM of right foot, ankle, and toes.  No joint effusions. Neurologic:  Normal speech and language. No gross focal neurologic deficits are appreciated. No gait instability. Skin:  Skin overlying dorsal aspect and lateral edge of right foot hot, erythematous and with evidence of areas of dried blood without obvious open wound.  Psychiatric: Mood and affect are normal. Speech and behavior are normal.  ____________________________________________   LABS (all labs ordered are listed, but only abnormal results are displayed)  Labs Reviewed  GROUP A STREP BY PCR - Abnormal; Notable for the following components:      Result Value   Group A Strep by PCR DETECTED (*)    All other components within normal limits  CBC - Abnormal; Notable for the following components:   WBC 11.0 (*)    All other components within normal limits  BASIC METABOLIC PANEL - Abnormal; Notable for the following components:   Sodium 134 (*)    Chloride 95 (*)    All other components within normal limits  CULTURE, BLOOD (ROUTINE X 2)  CULTURE, BLOOD (ROUTINE X 2)   ____________________________________________  EKG  Not indicated. ____________________________________________  RADIOLOGY  ED MD interpretation:    Official radiology report(s): No results found.  ____________________________________________   PROCEDURES  Procedure(s) performed (including Critical Care):  Procedures  ____________________________________________   INITIAL IMPRESSION / ASSESSMENT AND PLAN   29 year old female presents to the emergency department for treatment of pharyngitis. Nursing staff also noticed that she had cellulitis of her right foot while they were completing triage.  Plan will be to draw blood cultures, give IV fluids, IV antibiotics, as well as strep screen.  DIFFERENTIAL DIAGNOSIS  Pharyngitis, streptococcal pharyngitis, viral pharyngitis, COVID-19 Cellulitis, osteomyelitis  ED COURSE  When discussing  the plan of care with the patient, she refused everything except for the strep screen.  She states that she cannot stay here for any other testing because she will not have a ride home.  She was offered several suggestions and ideas in order to get taking care of of however she refused.  I have advised her of the possibility of sepsis and death.  She states that she will come back tomorrow to be treated.  I do feel that she is under the influence of some type of substance, however she is alert, oriented, and answers questions appropriately.  Dr. Lenard Lance also came back and spoke with her as well as nursing staff. She declines further treatment and signed AMA. I did give her a prescription for Clindamycin that she will hopefully fill and take 4 times per day. This should cover strep and make some headway with cellulitis. Hopefully she will return for proper treatment. ____________________________________________   FINAL CLINICAL IMPRESSION(S) / ED DIAGNOSES  Final diagnoses:  Pharyngitis, unspecified etiology  Cellulitis of right lower extremity     ED Discharge Orders         Ordered    clindamycin (CLEOCIN) 300 MG capsule  4  times daily     12/04/19 1654           Shelly Nunez was evaluated in Emergency Department on 12/04/2019 for the symptoms described in the history of present illness. She was evaluated in the context of the global COVID-19 pandemic, which necessitated consideration that the patient might be at risk for infection with the SARS-CoV-2 virus that causes COVID-19. Institutional protocols and algorithms that pertain to the evaluation of patients at risk for COVID-19 are in a state of rapid change based on information released by regulatory bodies including the CDC and federal and state organizations. These policies and algorithms were followed during the patient's care in the ED.   Note:  This document was prepared using Dragon voice recognition software and may include  unintentional dictation errors.   Chinita Pester, FNP 12/04/19 2011    Minna Antis, MD 12/04/19 2312

## 2019-12-04 NOTE — ED Provider Notes (Signed)
-----------------------------------------   4:54 PM on 12/04/2019 -----------------------------------------  I have personally seen and evaluated the patient in conjunction with nurse practitioner [facility].  Patient here for sore throat concerned that she could have strep throat.  She does have bilateral swollen erythematous tonsils but no obvious exudate.  Patient however also has fairly significant cellulitis to the dorsal aspect of her right foot.  Moderately tender to palpation.  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  Patient's white blood cell count is 11,000.  I did discuss with the patient given the swelling and significant tenderness my recommendation to admit her to the hospital for IV antibiotics.  Patient strongly does not wish to be admitted to the hospital.  I did talk to her about detox options, states she is planning to start a detox at [place] within the week and does not wish to be placed into detox.  Given the severity of the infection we will have the patient sign out AMA however we will cover her with clindamycin which should be sufficient coverage for both her tonsillitis and right foot cellulitis.  I discussed very strict return precautions with the patient for any increased erythema swelling or development of fever.  Patient agreeable.   Minna Antis, MD 12/04/19 [phone]

## 2019-12-04 NOTE — ED Notes (Signed)
Pt refusing blood cultures, states she just wants a strep test and then she wants to go home. EDP notified. Pt states " I will come back tomorrow, but I don't have a ride today that will wait for me." Pt informed about leaving AMA, pt encouraged to stay and be treated, pt continues to refuse.

## 2019-12-04 NOTE — ED Notes (Signed)
Pt agrees to strep test, refusing blood cultures and all other treatment. Pt told risks of leaving AMA, including spread of infection in foot. pt states understanding. Pt encouraged to return tomorrow.

## 2019-12-04 NOTE — ED Triage Notes (Signed)
Pt here for sore throat, thinks it's strep. Throat is red and swollen. Pt more worried about throat. R foot is swollen and red. Thinks she hit it. Appears like cellulitis. A&O, ambulatory. Refused wheelchair. Symptoms x few days.

## 2019-12-05 ENCOUNTER — Telehealth: Payer: Self-pay | Admitting: Emergency Medicine

## 2019-12-05 NOTE — Telephone Encounter (Signed)
Called patient to assure she is aware of positive strep test.  She was given rx for antibiotic to treat.  She did not answer and no voicemail.

## 2021-06-17 ENCOUNTER — Other Ambulatory Visit: Payer: Self-pay

## 2021-06-17 DIAGNOSIS — R911 Solitary pulmonary nodule: Secondary | ICD-10-CM | POA: Insufficient documentation

## 2021-06-17 DIAGNOSIS — Z79899 Other long term (current) drug therapy: Secondary | ICD-10-CM | POA: Insufficient documentation

## 2021-06-17 DIAGNOSIS — F1490 Cocaine use, unspecified, uncomplicated: Secondary | ICD-10-CM | POA: Insufficient documentation

## 2021-06-17 DIAGNOSIS — F17210 Nicotine dependence, cigarettes, uncomplicated: Secondary | ICD-10-CM | POA: Insufficient documentation

## 2021-06-17 DIAGNOSIS — R079 Chest pain, unspecified: Secondary | ICD-10-CM | POA: Insufficient documentation

## 2021-06-17 DIAGNOSIS — D71 Functional disorders of polymorphonuclear neutrophils: Secondary | ICD-10-CM | POA: Insufficient documentation

## 2021-06-17 DIAGNOSIS — R0781 Pleurodynia: Secondary | ICD-10-CM | POA: Insufficient documentation

## 2021-06-17 NOTE — ED Triage Notes (Addendum)
Pt In with co chest pain for about a month states worse when she [place] upset or takes a deep breath. Pt has no hx of cardiac disease, has never been evaluated for the same. Pt is an IV drug user, scheduled to go to [facility] tomorrow for rehab.

## 2021-06-18 ENCOUNTER — Encounter: Payer: Self-pay | Admitting: Radiology

## 2021-06-18 ENCOUNTER — Emergency Department: Payer: Self-pay

## 2021-06-18 ENCOUNTER — Emergency Department
Admission: EM | Admit: 2021-06-18 | Discharge: 2021-06-18 | Disposition: A | Discharge status: Home / Self Care | Payer: Self-pay | Attending: Emergency Medicine | Admitting: Emergency Medicine

## 2021-06-18 DIAGNOSIS — R079 Chest pain, unspecified: Secondary | ICD-10-CM

## 2021-06-18 DIAGNOSIS — D71 Functional disorders of polymorphonuclear neutrophils: Secondary | ICD-10-CM

## 2021-06-18 DIAGNOSIS — F1490 Cocaine use, unspecified, uncomplicated: Secondary | ICD-10-CM

## 2021-06-18 DIAGNOSIS — E041 Nontoxic single thyroid nodule: Secondary | ICD-10-CM

## 2021-06-18 LAB — COMPREHENSIVE METABOLIC PANEL
ALT: 208 U/L — ABNORMAL HIGH (ref 0–44)
AST: 120 U/L — ABNORMAL HIGH (ref 15–41)
Albumin: 3.9 g/dL (ref 3.5–5.0)
Alkaline Phosphatase: 76 U/L (ref 38–126)
Anion gap: 7 (ref 5–15)
BUN: 18 mg/dL (ref 6–20)
CO2: 30 mmol/L (ref 22–32)
Calcium: 9.3 mg/dL (ref 8.9–10.3)
Chloride: 102 mmol/L (ref 98–111)
Creatinine, Ser: 0.61 mg/dL (ref 0.44–1.00)
GFR, Estimated: >60 mL/min (ref >60)
Glucose, Bld: 87 mg/dL (ref 70–99)
Potassium: 4.5 mmol/L (ref 3.5–5.1)
Sodium: 139 mmol/L (ref 135–145)
Total Bilirubin: 0.8 mg/dL (ref 0.3–1.2)
Total Protein: 7.4 g/dL (ref 6.5–8.1)

## 2021-06-18 LAB — URINE DRUG SCREEN, QUALITATIVE (ARMC ONLY)
Amphetamines, Ur Screen: NOT DETECTED
Barbiturates, Ur Screen: NOT DETECTED
Benzodiazepine, Ur Scrn: NOT DETECTED
Cannabinoid 50 Ng, Ur ~~LOC~~: NOT DETECTED
Cocaine Metabolite,Ur ~~LOC~~: POSITIVE — AB
MDMA (Ecstasy)Ur Screen: NOT DETECTED
Methadone Scn, Ur: NOT DETECTED
Opiate, Ur Screen: NOT DETECTED
Phencyclidine (PCP) Ur S: NOT DETECTED
Tricyclic, Ur Screen: NOT DETECTED

## 2021-06-18 LAB — CBC
HCT: 35.6 % — ABNORMAL LOW (ref 36.0–46.0)
Hemoglobin: 11.7 g/dL — ABNORMAL LOW (ref 12.0–15.0)
MCH: 28 pg (ref 26.0–34.0)
MCHC: 32.9 g/dL (ref 30.0–36.0)
MCV: 85.2 fL (ref 80.0–100.0)
Platelets: 200 10*3/uL (ref 150–400)
RBC: 4.18 MIL/uL (ref 3.87–5.11)
RDW: 14.3 % (ref 11.5–15.5)
WBC: 6.3 10*3/uL (ref 4.0–10.5)
nRBC: 0 % (ref 0.0–0.2)

## 2021-06-18 LAB — PREGNANCY, URINE: Preg Test, Ur: NEGATIVE

## 2021-06-18 LAB — POC URINE PREG, ED: Preg Test, Ur: NEGATIVE

## 2021-06-18 LAB — TROPONIN I (HIGH SENSITIVITY): Troponin I (High Sensitivity): 2 ng/L (ref <18)

## 2021-06-18 LAB — LIPASE, BLOOD: Lipase: 30 U/L (ref 11–51)

## 2021-06-18 MED ORDER — IOHEXOL 350 MG/ML SOLN
75.0000 mL | Freq: Once | INTRAVENOUS | Status: AC | PRN
  Administered 2021-06-18: 75 mL via INTRAVENOUS

## 2021-06-18 MED ORDER — SODIUM CHLORIDE 0.9 % IV BOLUS
1000.0000 mL | Freq: Once | INTRAVENOUS | Status: AC
  Administered 2021-06-18: 1000 mL via INTRAVENOUS

## 2021-06-18 NOTE — ED Notes (Signed)
I attempted IV x2 unsuccessful prior to patient being transported to CT and CT attempted IV x2 unsuccessful. Patient returned to hall bed 18. IV team consult placed.

## 2021-06-18 NOTE — ED Provider Notes (Signed)
[facility] Emergency Department Provider Note   ____________________________________________   Event Date/Time   First MD Initiated Contact with Patient 06/18/21 0220     (approximate)  I have reviewed the triage vital signs and the nursing notes.   HISTORY  Chief Complaint Chest Pain    HPI Shelly Nunez is a 30 y.o. female who presents to the ED for medical clearance for rehab tomorrow.  Patient is an IV drug abuser scheduled to go to [facility] today for rehab.  Reports central chest pain for 1 month exacerbated when she is stressed or upset or takes a deep breath.  Denies current chest pain or shortness of breath.  Denies associated diaphoresis, palpitations, nausea/vomiting or dizziness.  Denies recent fever or cough      Past medical history IV drug user  There are no problems to display for this patient.   Past Surgical History:  Procedure Laterality Date   ANTERIOR CRUCIATE LIGAMENT REPAIR      Prior to Admission medications   Medication Sig Start Date End Date Taking? Authorizing Provider  ARIPiprazole (ABILIFY) 2 MG tablet Take 2 mg by mouth daily.    [provider]  aspirin-acetaminophen-caffeine (EXCEDRIN MIGRAINE) [phone] MG tablet Take 2 tablets by mouth every 6 (six) hours as needed for headache.    [provider]  lidocaine (XYLOCAINE) 2 % solution Use as directed 10 mLs in the mouth or throat as needed for mouth pain. 10/15/18   Enid Derry, PA-C  sertraline (ZOLOFT) 100 MG tablet Take 200 mg by mouth as directed. Takes 2 tablets in the morning    [provider]    Allergies Nickel, Penicillin g, and Penicillins  No family history on file.  Social History Social History   Tobacco Use   Smoking status: Every Day    Packs/day: 1.00    Types: Cigarettes   Smokeless tobacco: Never  Vaping Use   Vaping Use: Some days  Substance Use Topics   Alcohol use: Yes   Drug use: Yes    Types:  Cocaine, IV    Comment: heroine    Review of Systems  Constitutional: No fever/chills Eyes: No visual changes. ENT: No sore throat. Cardiovascular: Positive for chest pain. Respiratory: Denies shortness of breath. Gastrointestinal: No abdominal pain.  No nausea, no vomiting.  No diarrhea.  No constipation. Genitourinary: Negative for dysuria. Musculoskeletal: Negative for back pain. Skin: Negative for rash. Neurological: Negative for headaches, focal weakness or numbness.   ____________________________________________   PHYSICAL EXAM:  VITAL SIGNS: ED Triage Vitals  Enc Vitals Group     BP 06/17/21 2344 (!) 130/95     Pulse Rate 06/17/21 2344 82     Resp 06/17/21 2344 20     Temp 06/17/21 2344 98.3 F (36.8 C)     Temp Source 06/17/21 2344 Oral     SpO2 06/17/21 2344 96 %     Weight 06/17/21 2345 120 lb (54.4 kg)     Height 06/17/21 2345 6\' 3"  (1.905 m)     Head Circumference --      Peak Flow --      Pain Score 06/17/21 2345 4     Pain Loc --      Pain Edu? --      Excl. in GC? --     Constitutional: Alert and oriented.  Cachectic appearing and in no acute distress. Eyes: Conjunctivae are normal. PERRL. EOMI. Head: Atraumatic. Nose: No congestion/rhinnorhea. Mouth/Throat: Mucous membranes  are moist.   Neck: No stridor.   Cardiovascular: Normal rate, regular rhythm. Grossly normal heart sounds.  Good peripheral circulation. Respiratory: Normal respiratory effort.  No retractions. Lungs CTAB.  Anterior chest tender to palpation. Gastrointestinal: Soft and nontender. No distention. No abdominal bruits. No CVA tenderness. Musculoskeletal: No lower extremity tenderness nor edema.  No joint effusions. Neurologic:  Normal speech and language. No gross focal neurologic deficits are appreciated. No gait instability. Skin:  Skin is warm, dry and intact. No rash noted. Psychiatric: Mood and affect are normal. Speech and behavior are  normal.  ____________________________________________   LABS (all labs ordered are listed, but only abnormal results are displayed)  Labs Reviewed  CBC - Abnormal; Notable for the following components:      Result Value   Hemoglobin 11.7 (*)    HCT 35.6 (*)    All other components within normal limits  COMPREHENSIVE METABOLIC PANEL - Abnormal; Notable for the following components:   AST 120 (*)    ALT 208 (*)    All other components within normal limits  URINE DRUG SCREEN, QUALITATIVE ([facility] ONLY) - Abnormal; Notable for the following components:   Cocaine Metabolite,Ur [place] POSITIVE (*)    All other components within normal limits  PREGNANCY, URINE  LIPASE, BLOOD  POC URINE PREG, ED  TROPONIN I (HIGH SENSITIVITY)   ____________________________________________  EKG  ED ECG REPORT I, Renee Erb J, the attending physician, personally viewed and interpreted this ECG.   Date: 06/18/2021  EKG Time: 2349  Rate: 88  Rhythm: normal sinus rhythm  Axis: Normal  Intervals:none  ST&T Change: Nonspecific  ____________________________________________  RADIOLOGY I, Chrisann Melaragno J, personally viewed and evaluated these images (plain radiographs) as part of my medical decision making, as well as reviewing the written report by the radiologist.  ED MD interpretation: Chest x-ray demonstrates prior granulomatous disease; no PE, granulomatous disease, thyroid nodule  Official radiology report(s): DG Chest 2 View  Result Date: 06/18/2021 CLINICAL DATA:  Chest pain for 1 month EXAM: CHEST - 2 VIEW COMPARISON:  01/25/2018 FINDINGS: Bilateral nipple piercings are again seen. The cardiac shadow is within normal limits. Heavily calcified granuloma in the right middle lobe is again identified stable in appearance from prior exam. Some scattered smaller granulomas are seen stable from prior CT. No focal infiltrate is noted. Somewhat nodular density is noted over the left upper lobe which was not well  seen on the prior exam but may also represent a developing granuloma. Short-term follow-up with chest x-ray is recommended given the patient's age. No effusion is seen. No acute bony abnormality is noted. Mild stable scoliosis is seen. IMPRESSION: Changes consistent with prior granulomatous disease. Vague somewhat nodular density overlying the left upper lobe. Short-term follow-up is recommended as this likely represents additional granulomatous disease. Electronically Signed   By: Alcide Clever M.D.   On: 06/18/2021 00:24   CT Angio Chest PE W/Cm &/Or Wo Cm  Result Date: 06/18/2021 CLINICAL DATA:  30 year old female with chest pain, pleuritic pain. History of IV drug use. EXAM: CT ANGIOGRAPHY CHEST WITH CONTRAST TECHNIQUE: Multidetector CT imaging of the chest was performed using the standard protocol during bolus administration of intravenous contrast. Multiplanar CT image reconstructions and MIPs were obtained to evaluate the vascular anatomy. CONTRAST:  11mL OMNIPAQUE IOHEXOL 350 MG/ML SOLN COMPARISON:  CTA Chest, Abdomen, and Pelvis 01/25/2018. Chest radiographs 0001 hours today. FINDINGS: Cardiovascular: Adequate contrast bolus timing in the pulmonary arterial tree. No focal filling defect identified in the pulmonary  arteries to suggest acute pulmonary embolism. No cardiomegaly or pericardial effusion. Visible aorta remains within normal limits. Mediastinum/Nodes: Chronically calcified, somewhat bulky subcarinal and right hilar lymph nodes compatible with remote granulomatous disease. This is stable since 2019. No active mediastinal lymphadenopathy. Lungs/Pleura: Larger lung volumes. Major airways are patent. Bulky chronic 2 cm calcified granuloma in the lateral segment of the right middle lobe is unchanged. Smaller nearby calcified granulomas such as series 6, image 54. A tiny 2 mm subpleural nodule in the right lower lobe on series 6, image 49 is stable and also benign/postinflammatory. Lung parenchyma  otherwise within normal limits. No pleural effusion. Upper Abdomen: Negative visible liver, spleen (small calcified splenic granulomas), kidneys, and stomach in the upper abdomen. Musculoskeletal: Chronically exaggerated thoracic kyphosis. Vertebral bodies and disc spaces appear stable since 2019. Mild underlying scoliosis. No acute or suspicious osseous lesion. Other findings: Evidence of a chronic left thyroid lobe nodule, estimated at 15-17 mm by CT. Recommend thyroid US (ref: J Am Coll Radiol. 2015 Feb;12(2): 143-50). Review of the MIP images confirms the above findings. IMPRESSION: 1. Negative for acute pulmonary embolus, and no acute finding in the chest. 2. Sequelae of remote granulomatous disease unchanged since 2019. 3. Chronic left thyroid lobe nodule. Recommend Thyroid US (ref: J Am Coll Radiol. 2015 Feb;12(2): 143-50). Electronically Signed   By: Odessa Fleming M.D.   On: 06/18/2021 05:15    ____________________________________________   PROCEDURES  Procedure(s) performed (including Critical Care):  Procedures   ____________________________________________   INITIAL IMPRESSION / ASSESSMENT AND PLAN / ED COURSE  As part of my medical decision making, I reviewed the following data within the electronic MEDICAL RECORD NUMBER Nursing notes reviewed and incorporated, Labs reviewed, EKG interpreted, Old chart reviewed, Radiograph reviewed, and Notes from prior ED visits     30 year old female complaining of chest pain x1 month; here for medical clearance for [facility] for rehab for IVDA. Differential diagnosis includes, but is not limited to, ACS, aortic dissection, pulmonary embolism, cardiac tamponade, pneumothorax, pneumonia, pericarditis, myocarditis, GI-related causes including esophagitis/gastritis, and musculoskeletal chest wall pain.     Laboratory results demonstrate elevated transaminases without abdominal pain, cocaine metabolites, normal EKG and troponin.  Given evidence of granulomatous  disease on chest x-ray coupled with chest pain exacerbated with deep inspiration, will obtain CTA chest to evaluate for PE and better characterize granulomatous disease.  Patient is homeless.  Given warm blankets, food and drink for comfort.  Clinical Course as of 06/18/21 0521  Tue Jun 18, 2021  0521 Updated patient on CT results.  Strict return precautions given.  Patient verbalizes understanding and agrees with plan of care. [JS]    Clinical Course User Index [JS] Irean Hong, MD     ____________________________________________   FINAL CLINICAL IMPRESSION(S) / ED DIAGNOSES  Final diagnoses:  Nonspecific chest pain  Cocaine use  Thyroid nodule  Granulomatous disease, chronic [facility])     ED Discharge Orders     None        Note:  This document was prepared using Dragon voice recognition software and may include unintentional dictation errors.    Irean Hong, MD 06/18/21 [phone]

## 2021-06-18 NOTE — Discharge Instructions (Signed)
Go to [facility] as planned today.  Speak with your doctor about getting an ultrasound to further evaluate your thyroid nodule.  Return to the ER for worsening symptoms, persistent vomiting, difficulty breathing or other concerns.

## 2021-06-18 NOTE — ED Notes (Signed)
Patient transported to CT 

## 2021-07-19 ENCOUNTER — Encounter (HOSPITAL_COMMUNITY): Payer: Self-pay | Admitting: Radiology

## 2021-08-12 ENCOUNTER — Other Ambulatory Visit: Payer: Self-pay

## 2021-08-12 ENCOUNTER — Emergency Department
Admission: EM | Admit: 2021-08-12 | Discharge: 2021-08-12 | Disposition: A | Discharge status: Home / Self Care | Payer: Self-pay | Attending: Emergency Medicine | Admitting: Emergency Medicine

## 2021-08-12 DIAGNOSIS — F17210 Nicotine dependence, cigarettes, uncomplicated: Secondary | ICD-10-CM | POA: Insufficient documentation

## 2021-08-12 DIAGNOSIS — L03116 Cellulitis of left lower limb: Secondary | ICD-10-CM | POA: Insufficient documentation

## 2021-08-12 MED ORDER — CLINDAMYCIN HCL 150 MG PO CAPS
300.0000 mg | ORAL_CAPSULE | Freq: Once | ORAL | Status: AC
  Administered 2021-08-12: 01:00:00 300 mg via ORAL
  Filled 2021-08-12: qty 2

## 2021-08-12 NOTE — Discharge Instructions (Addendum)
Please take the clindamycin 3 times a day as we discussed.  We have given you the first dose here in the emergency room.  Return for increasing redness or pain or swelling.  Also please return for fever.  If you have diarrhea with a fever or severe diarrhea that will not stop with peptobismal remember that it could be due to c. difficile. This is potentially life threatening. Return here. Please do your best to go to [facility] for the other treatment we talked about.

## 2021-08-12 NOTE — ED Triage Notes (Signed)
Pt c/o left pain that started today. Pt denies injury or trauma.

## 2021-08-12 NOTE — ED Provider Notes (Signed)
[facility] Emergency Department Provider Note   ____________________________________________   None    (approximate)  I have reviewed the triage vital signs and the nursing notes.   HISTORY  Chief Complaint Foot Pain  Chief complaint is cellulitis of foot  HPI Shelly Nunez is a 30 y.o. female who reports she has frequent episodes of cellulitis often in her feet.  She uses injectable drugs and this gives her cellulitis.  She is usually treated she says with clindamycin which worked well for her.  She has a penicillin allergy.  Currently she is having a lot of pain in her foot and limping while she is going back and forth from visiting her grandfather who is a patient here.  She has 3 areas of cellulitis on the foot to about the size of a quarter and 1 is about 4 inches in diameter.  There are warm and tender and slightly swollen and red.         No past medical history on file. Past medical history cellulitis There are no problems to display for this patient.   Past Surgical History:  Procedure Laterality Date  . ANTERIOR CRUCIATE LIGAMENT REPAIR      Prior to Admission medications   Medication Sig Start Date End Date Taking? Authorizing Provider  ARIPiprazole (ABILIFY) 2 MG tablet Take 2 mg by mouth daily.    [provider]  aspirin-acetaminophen-caffeine (EXCEDRIN MIGRAINE) [phone] MG tablet Take 2 tablets by mouth every 6 (six) hours as needed for headache.    [provider]  lidocaine (XYLOCAINE) 2 % solution Use as directed 10 mLs in the mouth or throat as needed for mouth pain. 10/15/18   Enid Derry, PA-C  sertraline (ZOLOFT) 100 MG tablet Take 200 mg by mouth as directed. Takes 2 tablets in the morning    [provider]    Allergies Nickel, Penicillin g, and Penicillins  No family history on file.  Social History Social History   Tobacco Use  . Smoking status: Every Day    Packs/day: 1.00     Types: Cigarettes  . Smokeless tobacco: Never  Vaping Use  . Vaping Use: Some days  Substance Use Topics  . Alcohol use: Yes  . Drug use: Yes    Types: Cocaine, IV    Comment: heroine    Review of Systems  Constitutional: No fever/chills Eyes: No visual changes. ENT: No sore throat. Cardiovascular: Denies chest pain. Respiratory: Denies shortness of breath. Gastrointestinal: No abdominal pain.  No nausea, no vomiting.  No diarrhea.  No constipation. Genitourinary: Negative for dysuria. Musculoskeletal: Negative for back pain. Skin: Negative for rash.  See foot in HPI as described above Neurological: Negative for headaches, focal weakness or numbness.   ____________________________________________   PHYSICAL EXAM:  VITAL SIGNS: ED Triage Vitals  Enc Vitals Group     BP      Pulse      Resp      Temp      Temp src      SpO2      Weight      Height      Head Circumference      Peak Flow      Pain Score      Pain Loc      Pain Edu?      Excl. in GC?     Constitutional: Alert and oriented. Well appearing and in no acute distress except for when she is  walking when she is limping a lot. Eyes: Conjunctivae are normal.  Head: Atraumatic. Nose: No congestion/rhinnorhea. Mouth/Throat: Mucous membranes are moist.  Oropharynx non-erythematous. Neck: No stridor. Cardiovascular: Normal rate, regular rhythm. Grossly normal heart sounds.  Good peripheral circulation. Respiratory: Normal respiratory effort.  No retractions. Lungs CTAB. Gastrointestinal: Soft and nontender. No distention.  Musculoskeletal: No lower extremity tenderness nor edema except in left foot as described.  Also there is a slight hint of some redness going just above the ankle with minimal swelling but no tenderness. Neurologic:  Normal speech and language. No gross focal neurologic deficits are appreciated. No gait instability just a limp from the cellulitis. Skin:  Skin is warm, dry and intact except  for as noted on foot where the skin is intact but the cellulitis is present. No rash noted.   ____________________________________________   LABS (all labs ordered are listed, but only abnormal results are displayed)  Labs Reviewed - No data to display ____________________________________________  EKG   ____________________________________________  RADIOLOGY Jill Poling, personally viewed and evaluated these images (plain radiographs) as part of my medical decision making, as well as reviewing the written report by the radiologist.  ED MD interpretation:    Official radiology report(s): No results found.  ____________________________________________   PROCEDURES  Procedure(s) performed (including Critical Care):  Procedures   ____________________________________________   INITIAL IMPRESSION / ASSESSMENT AND PLAN / ED COURSE  There does not appear to be any sign of deeper infection.  Capillary refill is good.  Patient has no fever.  She knows she can go to [facility] for detox and has the contacts there.  She says she is try to go several times which time one of her grand parents has gotten second has, pulled her away from doing that.  I discussed with her the fact that they would probably appreciate it even more she want to get detoxed and was able to stop.  She promises she will try.  I have prescribed her clindamycin 300 mg 3 times a day and warned her about C. difficile.              ____________________________________________   FINAL CLINICAL IMPRESSION(S) / ED DIAGNOSES  Final diagnoses:  Cellulitis of left foot excluding toes     ED Discharge Orders     None        Note:  This document was prepared using Dragon voice recognition software and may include unintentional dictation errors.    Arnaldo Natal, MD 08/14/21 1209

## 2023-01-23 IMAGING — CT CT ANGIO CHEST
2 of 6 series · 18 of 46 positions shown · IV contrast (omnipaque)
Comparison: CTA Chest, Abdomen, and Pelvis 01/25/2018. Chest
radiographs 2224 hours today.

CLINICAL DATA: 29-year-old female with chest pain, pleuritic pain.
History of IV drug use.

EXAM:
CT ANGIOGRAPHY CHEST WITH CONTRAST
TECHNIQUE: Multidetector CT imaging of the chest was performed using the
standard protocol during bolus administration of intravenous
contrast. Multiplanar CT image reconstructions and MIPs were
obtained to evaluate the vascular anatomy.
CONTRAST:  75mL OMNIPAQUE IOHEXOL 350 MG/ML SOLN

[Series 5: thins · axial · 0.69mm/px · z∈[-328,-60]mm · 15 of 367 slices shown]
[im 16/367  lung]
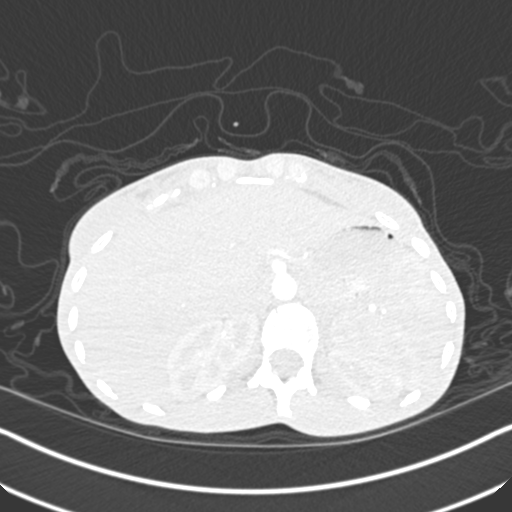
[im 46/367  soft-tissue]
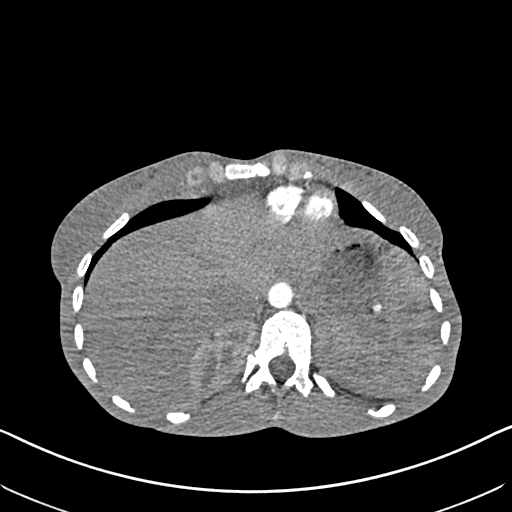
[im 62/367  lung]
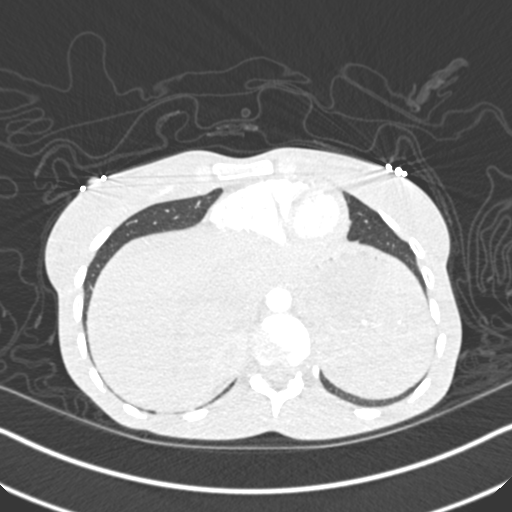
[im 92/367  soft-tissue]
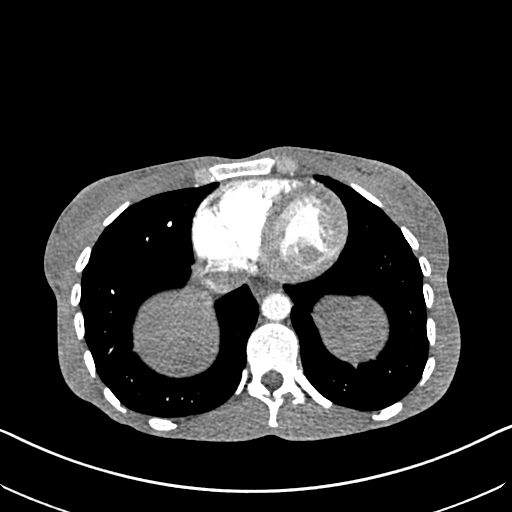
[im 107/367  lung]
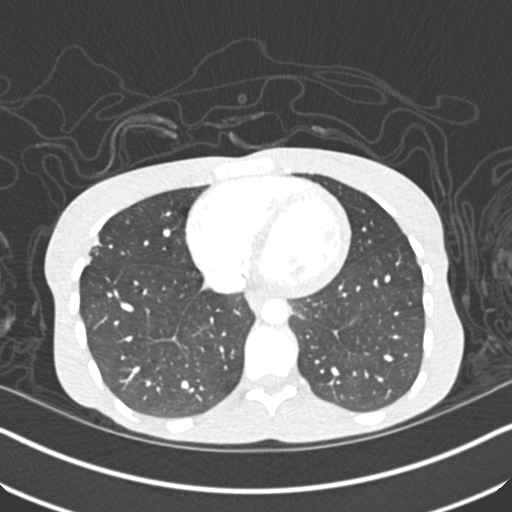
[im 138/367  soft-tissue]
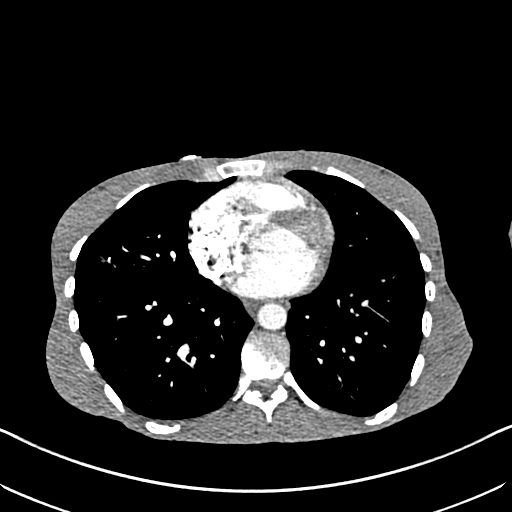
[im 153/367  lung]
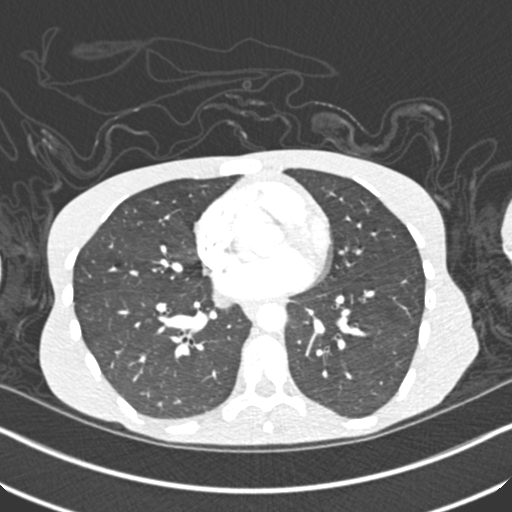
[im 184/367  soft-tissue]
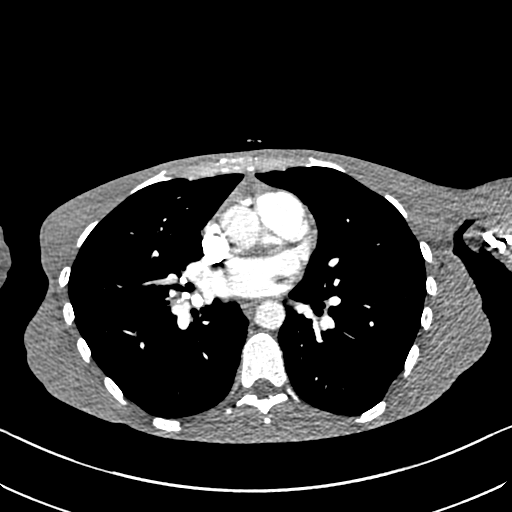
[im 214/367  lung]
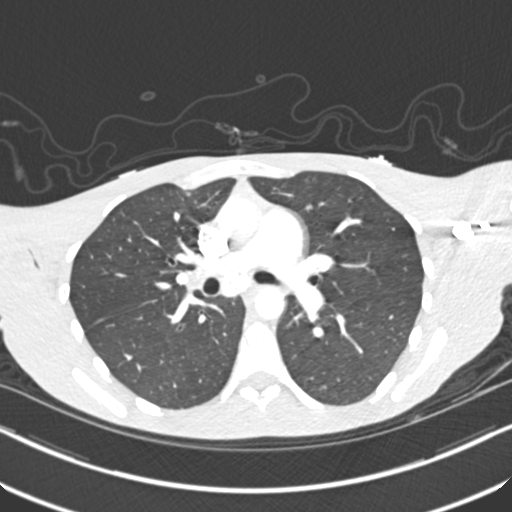
[im 229/367  soft-tissue]
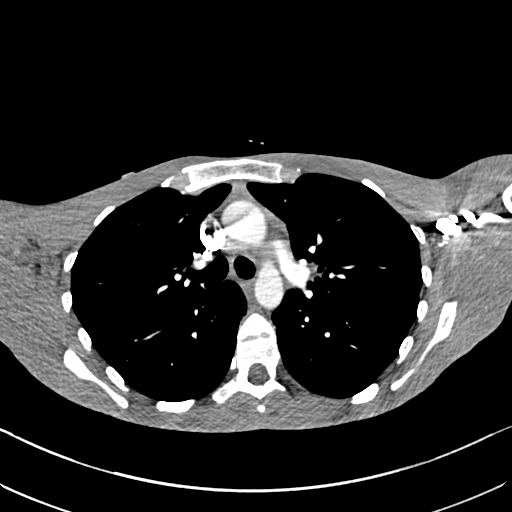
[im 260/367  lung]
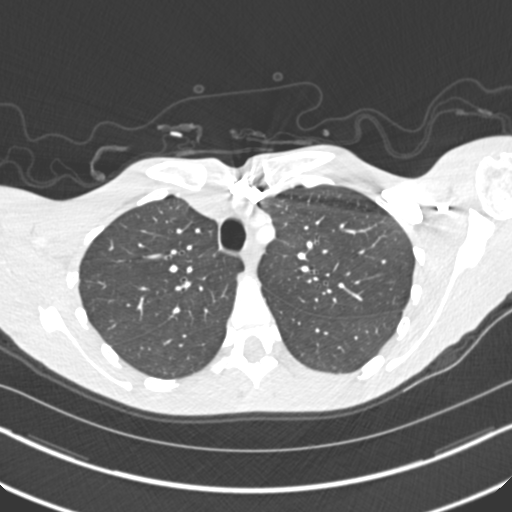
[im 275/367  soft-tissue]
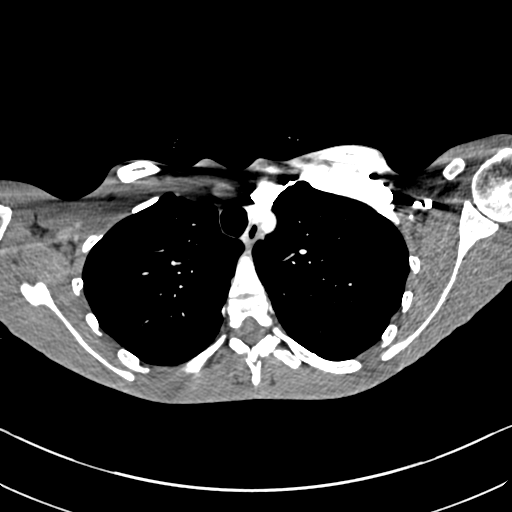
[im 306/367  lung]
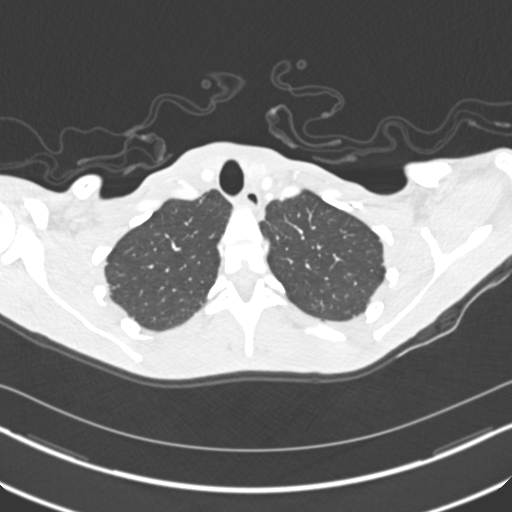
[im 321/367  soft-tissue]
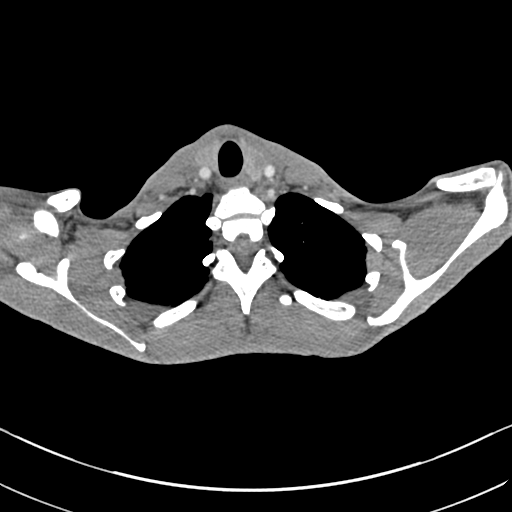
[im 351/367  lung]
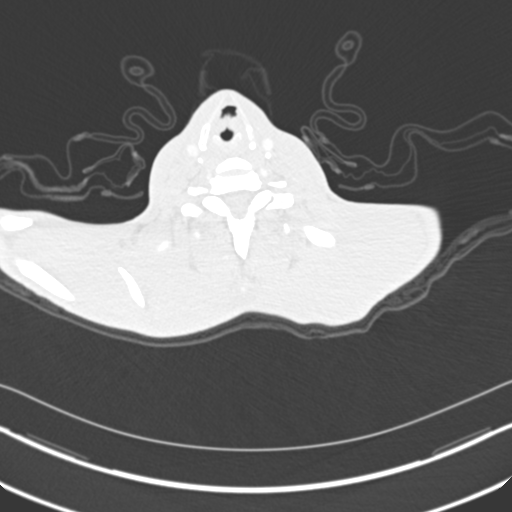

[Series 7: coronal mpr · coronal · 0.63mm/px · 3 of 72 slices shown]
[im 18/72  soft-tissue]
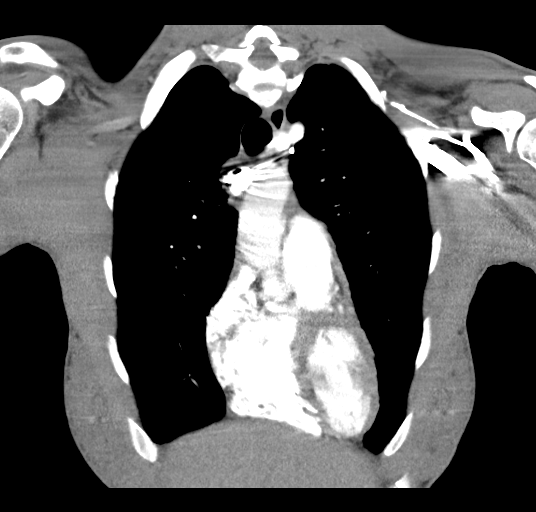
[im 36/72  soft-tissue]
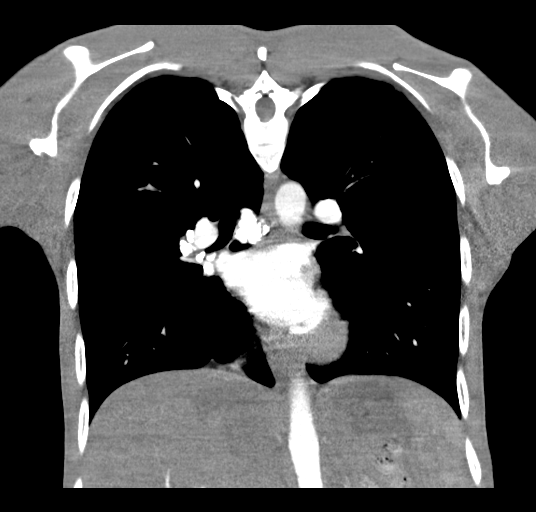
[im 54/72  soft-tissue]
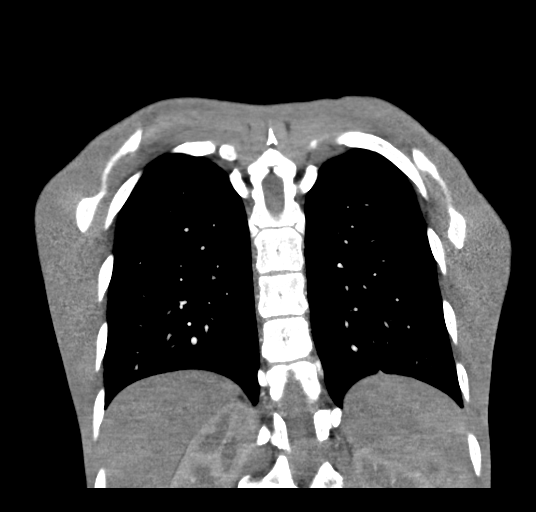

[18 of 46 positions shown; findings below may reference images not displayed]

FINDINGS: Cardiovascular: Adequate contrast bolus timing in the pulmonary
arterial tree.

No focal filling defect identified in the pulmonary arteries to
suggest acute pulmonary embolism.

No cardiomegaly or pericardial effusion. Visible aorta remains
within normal limits.

Mediastinum/Nodes: Chronically calcified, somewhat bulky subcarinal
and right hilar lymph nodes compatible with remote granulomatous
disease. This is stable since 3358. No active mediastinal
lymphadenopathy.

Lungs/Pleura: Larger lung volumes. Major airways are patent. Bulky
chronic 2 cm calcified granuloma in the lateral segment of the right
middle lobe is unchanged. Smaller nearby calcified granulomas such
as series 6, image 54. A tiny 2 mm subpleural nodule in the right
lower lobe on series 6, image 49 is stable and also
benign/postinflammatory.

Lung parenchyma otherwise within normal limits. No pleural effusion.

Upper Abdomen: Negative visible liver, spleen (small calcified
splenic granulomas), kidneys, and stomach in the upper abdomen.

Musculoskeletal: Chronically exaggerated thoracic kyphosis.
Vertebral bodies and disc spaces appear stable since 3358. Mild
underlying scoliosis. No acute or suspicious osseous lesion.

Other findings: Evidence of a chronic left thyroid lobe nodule,
estimated at 15-17 mm by CT. Recommend thyroid US (ref: [HOSPITAL]. [DATE]): 143-50).

Review of the MIP images confirms the above findings.
IMPRESSION: 1. Negative for acute pulmonary embolus, and no acute finding in the
chest.
2. Sequelae of remote granulomatous disease unchanged since [DATE]. Chronic left thyroid lobe nodule. Recommend Thyroid US (ref: [HOSPITAL]. [DATE]): 143-50).

## 2023-02-09 ENCOUNTER — Emergency Department: Payer: BC Managed Care – PPO

## 2023-02-09 ENCOUNTER — Emergency Department
Admission: EM | Admit: 2023-02-09 | Discharge: 2023-02-09 | Disposition: A | Discharge status: Home / Self Care | Payer: BC Managed Care – PPO | Attending: Emergency Medicine | Admitting: Emergency Medicine

## 2023-02-09 DIAGNOSIS — L089 Local infection of the skin and subcutaneous tissue, unspecified: Secondary | ICD-10-CM | POA: Insufficient documentation

## 2023-02-09 DIAGNOSIS — Z5329 Procedure and treatment not carried out because of patient's decision for other reasons: Secondary | ICD-10-CM | POA: Insufficient documentation

## 2023-02-09 DIAGNOSIS — M7989 Other specified soft tissue disorders: Secondary | ICD-10-CM | POA: Diagnosis present

## 2023-02-09 LAB — BASIC METABOLIC PANEL
Anion gap: 10 (ref 5–15)
BUN: 13 mg/dL (ref 6–20)
CO2: 24 mmol/L (ref 22–32)
Calcium: 9 mg/dL (ref 8.9–10.3)
Chloride: 101 mmol/L (ref 98–111)
Creatinine, Ser: 0.81 mg/dL (ref 0.44–1.00)
GFR, Estimated: >60 mL/min (ref >60)
Glucose, Bld: 156 mg/dL — ABNORMAL HIGH (ref 70–99)
Potassium: 3.5 mmol/L (ref 3.5–5.1)
Sodium: 135 mmol/L (ref 135–145)

## 2023-02-09 LAB — CBC WITH DIFFERENTIAL/PLATELET
Abs Immature Granulocytes: 0.02 10*3/uL (ref 0.00–0.07)
Basophils Absolute: 0 10*3/uL (ref 0.0–0.1)
Basophils Relative: 0 %
Eosinophils Absolute: 0.1 10*3/uL (ref 0.0–0.5)
Eosinophils Relative: 2 %
HCT: 38.9 % (ref 36.0–46.0)
Hemoglobin: 12.3 g/dL (ref 12.0–15.0)
Immature Granulocytes: 0 %
Lymphocytes Relative: 30 %
Lymphs Abs: 2.1 10*3/uL (ref 0.7–4.0)
MCH: 27.1 pg (ref 26.0–34.0)
MCHC: 31.6 g/dL (ref 30.0–36.0)
MCV: 85.7 fL (ref 80.0–100.0)
Monocytes Absolute: 0.4 10*3/uL (ref 0.1–1.0)
Monocytes Relative: 5 %
Neutro Abs: 4.5 10*3/uL (ref 1.7–7.7)
Neutrophils Relative %: 63 %
Platelets: 209 10*3/uL (ref 150–400)
RBC: 4.54 MIL/uL (ref 3.87–5.11)
RDW: 12.8 % (ref 11.5–15.5)
WBC: 7.1 10*3/uL (ref 4.0–10.5)
nRBC: 0 % (ref 0.0–0.2)

## 2023-02-09 LAB — LACTIC ACID, PLASMA: Lactic Acid, Venous: 1.8 mmol/L (ref 0.5–1.9)

## 2023-02-09 MED ORDER — CLINDAMYCIN PHOSPHATE 600 MG/50ML IV SOLN
600.0000 mg | Freq: Once | INTRAVENOUS | Status: AC
  Administered 2023-02-09: 600 mg via INTRAVENOUS
  Filled 2023-02-09: qty 50

## 2023-02-09 MED ORDER — KETOROLAC TROMETHAMINE 30 MG/ML IJ SOLN
15.0000 mg | Freq: Once | INTRAMUSCULAR | Status: AC
  Administered 2023-02-09: 15 mg via INTRAVENOUS
  Filled 2023-02-09: qty 1

## 2023-02-09 MED ORDER — CLINDAMYCIN HCL 150 MG PO CAPS
300.0000 mg | ORAL_CAPSULE | Freq: Once | ORAL | Status: AC
  Administered 2023-02-09: 300 mg via ORAL
  Filled 2023-02-09: qty 2

## 2023-02-09 NOTE — ED Triage Notes (Signed)
Arrives to [facility] via [facility] for RLE swelling and erythema x1 weeks.  Per patient she was walking around barefoot with wet feet last week in the rain.  Moderate hematoma noted on plantar surface or right foot.  Denies any fevers chills at home.  DP pulse obtained via doppler.

## 2023-02-09 NOTE — ED Provider Notes (Signed)
Unfortunately, patient left without completion of treatment.  She did not wait to receive results from her MRI, but knew we were awaiting them.  I had informed her that her MRI result was pending, but she eloped while I was assessing and caring for another patient who presented with critical injury.  Nursing notified me as soon as I had complete evaluation of high-priority patient.  At that time I attempted to call and locate the patient, no answer on her listed number.  Was not able to leave a voicemail.  Nursing did discuss with her the importance of staying and did tell her that she needed to come back.  Evidently her ride came and she left quickly thereafter.  I suspect that the patient's underlying history of substance abuse may have also figured into her desire to promptly leave today.  Unfortunately I do believe she may have a foot or potential limb threatening infection given concern for possible abscess with cellulitis and she is not currently on antibiotic.  I have sent a message to our ED nurse navigator, Sheran Luz, to attempt further to contact the patient for need for antibiotic treatment and strongly recommend she represent to the ER for further care. Requested additional attempt to contact and to sent a letter to her as well. My plan was to discuss with podiatry further recommendations for further treatment, but at this point the patient has left the ER against the advice of the nurse who is caring for her, and is unreachable at this time.   Sharyn Creamer, MD 02/09/23 2310

## 2023-02-09 NOTE — ED Provider Notes (Signed)
[facility] Provider Note    Event Date/Time   First MD Initiated Contact with Patient 02/09/23 1950     (approximate)   History   Right foot pain and swelling  HPI  Shelly Nunez is a 32 y.o. female reports a history of previous mental health issues such as depression which are doing well at this time, as well as a history of ongoing daily IV fentanyl use, and frequent infections in her feet but seem to do well with clindamycin treatment  For about 1 week now she has been experiencing redness and pain in her right foot.  The whole right foot has become swollen.  She started develop an area of blister at the base of her toe.  No fevers or chills no chest pain.  No leg swelling except from about the right lower part of her ankle down and redness.  Symptoms have persisted.  She walks around frequently and was out in the rain when this started.  She is not certain but felt at 1 point like she could have had some sort of foreign body in the foot  She is a daily fentanyl user.  She reports she has previously enrolled in rehab and missed an opportunity for 90-day treatment in [place] just last week.  She is not currently interested in pursuing substance abuse treatment at this time but does report that she does have a supply of naloxone that she keeps with her         Physical Exam   Triage Vital Signs: ED Triage Vitals  Enc Vitals Group     BP 02/09/23 1950 113/82     Pulse Rate 02/09/23 1950 88     Resp 02/09/23 1950 18     Temp 02/09/23 1950 98.9 F (37.2 C)     Temp Source 02/09/23 1950 Oral     SpO2 02/09/23 1950 100 %     Weight 02/09/23 1948 115 lb (52.2 kg)     Height --      Head Circumference --      Peak Flow --      Pain Score 02/09/23 1948 0     Pain Loc --      Pain Edu? --      Excl. in GC? --     Most recent vital signs: Vitals:   02/09/23 1950 02/09/23 2204  BP: 113/82 122/81  Pulse: 88 65  Resp: 18 12  Temp: 98.9 F (37.2  C)   SpO2: 100% 97%     General: Awake, no distress.  Very pleasant fully oriented no distress CV:  Good peripheral perfusion.  Strong right dorsalis pedis and posterior tibial pulse with normal capillary refill all digits of the right foot Resp:  Normal effort.  Bilateral Normal heart tones.  No murmurs. Abd:  No distention.  Other:  See clinical media uploaded.  Left foot appears normal to inspection.  The right foot is moderately edematous, erythematous, and overlying the right mid tarsal approximately along the plantar surface of the foot is approximately 2 x 3 cm area of skin blistering.  There is no associated necrosis.  Capillary refill is normal in all toes.  The right foot is warm to touch and extends to about the level of the midfoot/proximal ankle.  There is no extension streaking or lymphangitis extending further into the calf or up towards the knee area.  Track marks are evident in the patient's antecubital regions and hands.  No areas of injection appear to be erythematous or infected at this time.    ED Results / Procedures / Treatments   Labs (all labs ordered are listed, but only abnormal results are displayed) Labs Reviewed  BASIC METABOLIC PANEL - Abnormal; Notable for the following components:      Result Value   Glucose, Bld 156 (*)    All other components within normal limits  CULTURE, BLOOD (SINGLE)  CBC WITH DIFFERENTIAL/PLATELET  LACTIC ACID, PLASMA     EKG     RADIOLOGY   I personally interpreted the patient's right foot x-ray is negative for foreign body   MR FOOT RIGHT WO CONTRAST  Result Date: 02/09/2023 CLINICAL DATA:  Foot swelling, diabetic, osteomyelitis suspected. EXAM: MRI OF THE RIGHT FOREFOOT WITHOUT CONTRAST TECHNIQUE: Multiplanar, multisequence MR imaging of the right forefoot was performed. No intravenous contrast was administered. COMPARISON:  Radiographs dated Feb 09, 2023 FINDINGS: Bones/Joint/Cartilage Mild marrow edema in the  distal phalanx of the first digit. Ligaments Lisfranc and collateral ligaments are intact. Muscles and Tendons Muscles and tendons are within normal limits. Mild soft tissue edema about the plantar aspect of the flexor hallucis longus. Soft tissues There is a complex fluid collection about the medial aspect of the proximal phalanx of the first digit. There is small skin wound about the plantar aspect of the proximal phalanx of the first digit. There is marked soft tissue edema about the dorsum of the foot. IMPRESSION: 1. Complex fluid collection about the medial aspect of the proximal phalanx of the first digit, concerning for abscess. 2. Very mild marrow edema in the distal phalanx of the first digit, nonspecific, it could be reactive secondary to altered mechanics and adjacent infectious/inflammatory process. Osteomyelitis is less likely. Clinical correlation and follow-up examination is suggested. 3. Mild soft tissue edema about the plantar aspect of the flexor hallucis longus, concerning for small skin wound. 4. Marked soft tissue edema about the dorsum of the foot. Electronically Signed   By: Larose Hires D.O.   On: 02/09/2023 22:09   DG Foot 2 Views Right  Result Date: 02/09/2023 CLINICAL DATA:  Swelling. Exclude metallic foreign body prior to MRI. Stool on the lateral side of the right big toe. EXAM: RIGHT FOOT - 2 VIEW COMPARISON:  None Available. FINDINGS: There is no evidence of fracture or dislocation. No cortical erosion or periosteal reaction. Soft tissue swelling about the first metatarsophalangeal joint and dorsum of the foot. No radiopaque foreign body identified. IMPRESSION: 1. No radiopaque foreign body to preclude MRI examination. 2. Soft tissue swelling about the first metatarsophalangeal joint and about the dorsum of the foot. Electronically Signed   By: Larose Hires D.O.   On: 02/09/2023 20:45      PROCEDURES:  Critical Care performed: No  Procedures   MEDICATIONS ORDERED IN  ED: Medications  ketorolac (TORADOL) 30 MG/ML injection 15 mg (15 mg Intravenous Given 02/09/23 2017)  clindamycin (CLEOCIN) IVPB 600 mg (0 mg Intravenous Stopped 02/09/23 2204)  clindamycin (CLEOCIN) capsule 300 mg (300 mg Oral Given 02/09/23 2210)     IMPRESSION / MDM / ASSESSMENT AND PLAN / ED COURSE  I reviewed the triage vital signs and the nursing notes.                              Differential diagnosis includes, but is not limited to, cellulitis or potential deep space infection, seeding of the right foot etc.  Patient has a history of recurrent cellulitis that seems to occur frequently and has been treated successfully with clindamycin in the past.  She has no fever or elevated white count.  Her labs are interpreted as normal CBC and metabolic panel.  She does not have clinical evidence that would be suggestive of endocarditis or obvious seeding of this area.  She has an IV substance abuser and freely admits though and is not currently interested in treatment for it but does keep naloxone and clearly understands the risks of overdose and potential death from substance abuse.  Was given a dose of IV clindamycin, approximately one third of it infused when the patient got up to use the bathroom and her IV pulled out before entering the bathroom.  She notified staff, and we will give her oral clindamycin instead.  Awaiting MRI of the right foot.  Suspect likely significant cellulitis without evidence of sepsis, but wish to evaluate for possible underlying abscess, deep space infection, or osteomyelitis.  Patient's presentation is most consistent with acute complicated illness / injury requiring diagnostic workup.          FINAL CLINICAL IMPRESSION(S) / ED DIAGNOSES   Final diagnoses:  Right foot infection     Rx / DC Orders   ED Discharge Orders     None        Note:  This document was prepared using Dragon voice recognition software and may include unintentional  dictation errors.   Sharyn Creamer, MD 02/10/23 [phone]

## 2023-02-09 NOTE — ED Notes (Addendum)
Pt refusing to stay for continued medical treatment after risk explained. Pt sts, "my ride is here and I need to go." Pt with hospital gloves wrapped into paper towel as well as cigarette pack. Pts assigned RN called to ensure IV was removed and that all belongings were with pt. Pts RN confirms as well as sts that pt was just in bathroom and then came out in a hurry to leave. Pt asked to sign AMA but refuses and continues to walk towards lobby. Pt ambulatory with steady gait to lobby. Writer and assigned RN in bathroom to find used cigarettes in trash with odder of smoke. EVS called to open metal box to ensure to sharps or harmful substances found.

## 2023-02-09 NOTE — ED Notes (Signed)
Pt left with radiology, and pt talked with MRI on the phone. Pt given boxed lunch and drinks.

## 2023-02-09 NOTE — ED Notes (Addendum)
Pt requesting to leave due to her ride being here, this RN educated pt that she should stay for further treatment and care, pt refused and left AMA. This RN educated pt that she should come back to get further antibiotics so her foot wound does not get worse.

## 2023-02-10 ENCOUNTER — Telehealth: Payer: Self-pay | Admitting: Emergency Medicine

## 2023-02-10 NOTE — Telephone Encounter (Signed)
Called patient at request of Dr. Fanny Bien.  He was unable to contact her after she lwcc and wanted to recommend admission and give antibiotics.  Phone says cannot take call at this time.  No VM available.  Will send letter to patient.

## 2023-02-14 LAB — CULTURE, BLOOD (SINGLE): Culture: NO GROWTH
